# Patient Record
Sex: Male | Born: 2000 | Race: White | Hispanic: No | Marital: Single | Smoking: Never smoker
Health system: Southern US, Community
[De-identification: ages and names within clinical notes are randomized; demographics above are authoritative.]

## PROBLEM LIST (undated history)

## (undated) DIAGNOSIS — F32A Depression, unspecified: Secondary | ICD-10-CM

## (undated) DIAGNOSIS — F39 Unspecified mood [affective] disorder: Secondary | ICD-10-CM

## (undated) HISTORY — PX: APPENDECTOMY: SHX54

---

## 2019-08-13 ENCOUNTER — Ambulatory Visit (HOSPITAL_COMMUNITY)
Admission: RE | Admit: 2019-08-13 | Discharge: 2019-08-13 | Disposition: A | Discharge status: Home / Self Care | Attending: Psychiatry | Admitting: Psychiatry

## 2019-08-13 DIAGNOSIS — F331 Major depressive disorder, recurrent, moderate: Secondary | ICD-10-CM | POA: Insufficient documentation

## 2019-08-13 DIAGNOSIS — F1210 Cannabis abuse, uncomplicated: Secondary | ICD-10-CM | POA: Diagnosis not present

## 2019-08-14 ENCOUNTER — Encounter (HOSPITAL_COMMUNITY): Payer: Self-pay | Admitting: Psychiatry

## 2019-08-14 ENCOUNTER — Inpatient Hospital Stay (HOSPITAL_COMMUNITY)
Admission: RE | Admit: 2019-08-14 | Discharge: 2019-08-15 | DRG: 885 | Disposition: A | Discharge status: Home / Self Care | Attending: Psychiatry | Admitting: Psychiatry

## 2019-08-14 ENCOUNTER — Other Ambulatory Visit: Payer: Self-pay

## 2019-08-14 DIAGNOSIS — F1290 Cannabis use, unspecified, uncomplicated: Secondary | ICD-10-CM | POA: Diagnosis present

## 2019-08-14 DIAGNOSIS — Z915 Personal history of self-harm: Secondary | ICD-10-CM

## 2019-08-14 DIAGNOSIS — F649 Gender identity disorder, unspecified: Secondary | ICD-10-CM | POA: Diagnosis present

## 2019-08-14 DIAGNOSIS — F331 Major depressive disorder, recurrent, moderate: Principal | ICD-10-CM | POA: Diagnosis present

## 2019-08-14 DIAGNOSIS — R45851 Suicidal ideations: Secondary | ICD-10-CM | POA: Diagnosis present

## 2019-08-14 DIAGNOSIS — F411 Generalized anxiety disorder: Secondary | ICD-10-CM | POA: Diagnosis present

## 2019-08-14 DIAGNOSIS — U071 COVID-19: Secondary | ICD-10-CM | POA: Diagnosis present

## 2019-08-14 DIAGNOSIS — X838XXA Intentional self-harm by other specified means, initial encounter: Secondary | ICD-10-CM

## 2019-08-14 DIAGNOSIS — Z79899 Other long term (current) drug therapy: Secondary | ICD-10-CM | POA: Diagnosis not present

## 2019-08-14 DIAGNOSIS — F322 Major depressive disorder, single episode, severe without psychotic features: Secondary | ICD-10-CM | POA: Diagnosis present

## 2019-08-14 LAB — RESPIRATORY PANEL BY RT PCR (FLU A&B, COVID)
Influenza A by PCR: NEGATIVE
Influenza B by PCR: NEGATIVE
SARS Coronavirus 2 by RT PCR: POSITIVE — AB

## 2019-08-14 LAB — URINALYSIS, ROUTINE W REFLEX MICROSCOPIC
Bilirubin Urine: NEGATIVE
Glucose, UA: NEGATIVE mg/dL
Hgb urine dipstick: NEGATIVE
Ketones, ur: NEGATIVE mg/dL
Leukocytes,Ua: NEGATIVE
Nitrite: NEGATIVE
Protein, ur: NEGATIVE mg/dL
Specific Gravity, Urine: 1.016 (ref 1.005–1.030)
pH: 7 (ref 5.0–8.0)

## 2019-08-14 LAB — LIPID PANEL
Cholesterol: 140 mg/dL (ref 0–200)
HDL: 45 mg/dL (ref >40)
LDL Cholesterol: 61 mg/dL (ref 0–99)
Total CHOL/HDL Ratio: 3.1 RATIO
Triglycerides: 169 mg/dL — ABNORMAL HIGH (ref <150)
VLDL: 34 mg/dL (ref 0–40)

## 2019-08-14 LAB — COMPREHENSIVE METABOLIC PANEL
ALT: 16 U/L (ref 0–44)
AST: 16 U/L (ref 15–41)
Albumin: 4.1 g/dL (ref 3.5–5.0)
Alkaline Phosphatase: 45 U/L (ref 38–126)
Anion gap: 7 (ref 5–15)
BUN: 18 mg/dL (ref 6–20)
CO2: 28 mmol/L (ref 22–32)
Calcium: 9.1 mg/dL (ref 8.9–10.3)
Chloride: 105 mmol/L (ref 98–111)
Creatinine, Ser: 1.13 mg/dL (ref 0.61–1.24)
GFR calc Af Amer: >60 mL/min (ref >60)
GFR calc non Af Amer: >60 mL/min (ref >60)
Glucose, Bld: 84 mg/dL (ref 70–99)
Potassium: 4.3 mmol/L (ref 3.5–5.1)
Sodium: 140 mmol/L (ref 135–145)
Total Bilirubin: 0.6 mg/dL (ref 0.3–1.2)
Total Protein: 6.9 g/dL (ref 6.5–8.1)

## 2019-08-14 LAB — RAPID URINE DRUG SCREEN, HOSP PERFORMED
Amphetamines: NOT DETECTED
Barbiturates: NOT DETECTED
Benzodiazepines: NOT DETECTED
Cocaine: NOT DETECTED
Opiates: NOT DETECTED
Tetrahydrocannabinol: NOT DETECTED

## 2019-08-14 LAB — TSH: TSH: 0.706 u[IU]/mL (ref 0.350–4.500)

## 2019-08-14 LAB — CBC
HCT: 43.5 % (ref 39.0–52.0)
Hemoglobin: 14.6 g/dL (ref 13.0–17.0)
MCH: 29.2 pg (ref 26.0–34.0)
MCHC: 33.6 g/dL (ref 30.0–36.0)
MCV: 87 fL (ref 80.0–100.0)
Platelets: 240 10*3/uL (ref 150–400)
RBC: 5 MIL/uL (ref 4.22–5.81)
RDW: 12.2 % (ref 11.5–15.5)
WBC: 5.1 10*3/uL (ref 4.0–10.5)
nRBC: 0 % (ref 0.0–0.2)

## 2019-08-14 LAB — HEMOGLOBIN A1C
Hgb A1c MFr Bld: 4.8 % (ref 4.8–5.6)
Mean Plasma Glucose: 91.06 mg/dL

## 2019-08-14 LAB — ETHANOL: Alcohol, Ethyl (B): <10 mg/dL (ref <10)

## 2019-08-14 MED ORDER — ALUM & MAG HYDROXIDE-SIMETH 200-200-20 MG/5ML PO SUSP: 30.0000 mL | ORAL | Status: DC | PRN

## 2019-08-14 MED ORDER — MAGNESIUM HYDROXIDE 400 MG/5ML PO SUSP: 30.0000 mL | Freq: Every day | ORAL | Status: DC | PRN

## 2019-08-14 MED ORDER — FLUOXETINE HCL 20 MG PO CAPS
20.0000 mg | ORAL_CAPSULE | Freq: Every day | ORAL | Status: DC
  Administered 2019-08-14 – 2019-08-15 (×2): 20 mg via ORAL
  Filled 2019-08-14 (×2): qty 1
  Filled 2019-08-14: qty 7
  Filled 2019-08-14: qty 1

## 2019-08-14 MED ORDER — ACETAMINOPHEN 325 MG PO TABS: 650.0000 mg | ORAL_TABLET | Freq: Four times a day (QID) | ORAL | Status: DC | PRN

## 2019-08-14 MED ORDER — TRAZODONE HCL 50 MG PO TABS
50.0000 mg | ORAL_TABLET | Freq: Once | ORAL | Status: AC
  Administered 2019-08-14: 50 mg via ORAL
  Filled 2019-08-14: qty 1

## 2019-08-14 MED ORDER — PRENATAL MULTIVITAMIN CH
1.0000 | ORAL_TABLET | Freq: Every day | ORAL | Status: DC
  Administered 2019-08-14: 1 via ORAL
  Filled 2019-08-14 (×4): qty 1

## 2019-08-14 MED ORDER — HYDROXYZINE HCL 25 MG PO TABS
25.0000 mg | ORAL_TABLET | Freq: Once | ORAL | Status: AC
  Administered 2019-08-14: 25 mg via ORAL
  Filled 2019-08-14: qty 1

## 2019-08-14 MED ORDER — TEMAZEPAM 15 MG PO CAPS
30.0000 mg | ORAL_CAPSULE | Freq: Every day | ORAL | Status: DC
  Administered 2019-08-14: 22:00:00 30 mg via ORAL
  Filled 2019-08-14: qty 2

## 2019-08-14 NOTE — H&P (Signed)
Psychiatric Admission Assessment Adult  Patient Identification: Edwin Burton MRN:  [id number] Date of Evaluation:  08/14/2019 Chief Complaint:  MDD Principal Diagnosis: Suicidal ideation Diagnosis:  Principal Problem:   Suicidal ideation Active Problems:   Anxiety state   Suicide (HCC)  History of Present Illness:   This is a 19 year old student presented voluntarily due to the severity of suicidal thoughts.  The patient had been on Lexapro and Abilify previously but is not on it now.  The patient acknowledges cannabis usage "about 1 time a week".  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  Further this is a transgender use, who cannot report why the depression was of recurrence other than being off of antidepressants. 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  According to the assessment team note  Edwin Burton is an 19 y.o. male.  -Patient is a Consulting civil engineer at [employer].  A friend brought her to [facility] from campus.  Patient is a male transitioning to male and prefers male pronouns.  Pt says she has been having SI more intensely over the last four days.  Patient says that "If I had a gun in front of me, I would use it."  Patient has no access to a gun at this time.  He says there is one in her home in [facility] but it is secured.  Patient has had about 5 previous suicide attempts.  Patient denies any HI or A/V hallucinations.  Patient says she uses ETOH about once in a month.  Her marijuana use is about once per week.  Last use was on Saturday evening (02/27).    Patient says she started the transition in November 2020 and is supposed to start HRT later this month.  Patient has a gender therapist.  She says she has a psychiatrist named Dr.  Elmer Picker.  Patient says she has run out of her medication and has not had any in the last few days.  Patient says she felt that her meds were not working so she doubled up on the medications per day.  She says that it helped but now she is without medications.  Patient has a anxious affect.  She is not responding to internal stimuli.  She has thought process which is logical and coherent.  Patient has poor eye contact.  She is oriented x4.  Pt has fair appetite and poor concentration.  -Clinician discussed patient care with Lerry Liner, NP  She recommends patient be observed overnight and seen by psychiatry in AM.  Diagnosis: F33.1 MDD recurrent episode, moderate; F12.10 Cannabis use d/o mild  Associated Signs/Symptoms: Depression Symptoms:  anhedonia, (Hypo) Manic Symptoms:  n/a Anxiety Symptoms:  Excessive Worry, Psychotic Symptoms:  n/a PTSD Symptoms: NA Total Time spent with patient: 45 minutes  Past Psychiatric History: 1 prior admission  Is the patient at risk to self? Yes.    Has the patient been a risk to self in the past 6 months? No.  Has the patient been a risk to self within the distant past? No.  Is the patient a risk to others? No.  Has the patient been a risk to others in the past 6 months? No.  Has the patient been a risk to others within the distant past? No.   Prior  Inpatient Therapy:   Prior Outpatient Therapy:    Alcohol Screening: 1. How often do you have a drink containing alcohol?: Monthly or less 2. How many drinks containing alcohol do you have on a typical day when you are drinking?: 1 or 2 3. How often do you have six or more drinks on one occasion?: Less than monthly AUDIT-C Score: 2 4. How often during the last year have you found that you were not able to stop drinking once you had started?: Never 5. How often during the last year have you failed to do what was normally expected from you becasue of drinking?: Less than monthly 6. How often during the  last year have you needed a first drink in the morning to get yourself going after a heavy drinking session?: Less than monthly 7. How often during the last year have you had a feeling of guilt of remorse after drinking?: Less than monthly 8. How often during the last year have you been unable to remember what happened the night before because you had been drinking?: Less than monthly 9. Have you or someone else been injured as a result of your drinking?: No 10. Has a relative or friend or a doctor or another health worker been concerned about your drinking or suggested you cut down?: No Alcohol Use Disorder Identification Test Final Score (AUDIT): 6 Alcohol Brief Interventions/Follow-up: AUDIT Score <7 follow-up not indicated Substance Abuse History in the last 12 months:  Yes.   Consequences of Substance Abuse: NA Previous Psychotropic Medications: Yes  Psychological Evaluations: No  Past Medical History: History reviewed. No pertinent past medical history. History reviewed. No pertinent surgical history. Family History: History reviewed. No pertinent family history. Family Psychiatric  History: Denies Tobacco Screening:   Social History:  Social History   Substance and Sexual Activity  Alcohol Use None     Social History   Substance and Sexual Activity  Drug Use Not on file    Additional Social History:                           Allergies:  No Known Allergies Lab Results:  Results for orders placed or performed during the hospital encounter of 08/14/19 (from the past 48 hour(s))  Respiratory Panel by RT PCR (Flu A&B, Covid) - Nasopharyngeal Swab     Status: Abnormal   Collection Time: 08/14/19  1:01 AM   Specimen: Nasopharyngeal Swab  Result Value Ref Range   SARS Coronavirus 2 by RT PCR POSITIVE (A) NEGATIVE    Comment: RESULT CALLED TO, READ BACK BY AND VERIFIED WITH: WRIGHT,J. RN AT 0900 08/14/19 MULLINS,T (NOTE) SARS-CoV-2 target nucleic acids are  DETECTED. SARS-CoV-2 RNA is generally detectable in upper respiratory specimens  during the acute phase of infection. Positive results are indicative of the presence of the identified virus, but do not rule out bacterial infection or co-infection with other pathogens not detected by the test. Clinical correlation with patient history and other diagnostic information is necessary to determine patient infection status. The expected result is Negative. Fact Sheet for Patients:  https://www.moore.com/ Fact Sheet for Healthcare Providers: https://www.young.biz/ This test is not yet approved or cleared by the [place] FDA and  has been authorized for detection and/or diagnosis of SARS-CoV-2 by FDA under an Emergency Use Authorization (EUA).  This EUA will remain in effect (meaning this test can be use d) for the duration of  the COVID-19 declaration under Section  564(b)(1) of the Act, 21 U.S.C. section 360bbb-3(b)(1), unless the authorization is terminated or revoked sooner.    Influenza A by PCR NEGATIVE NEGATIVE   Influenza B by PCR NEGATIVE NEGATIVE    Comment: (NOTE) The Xpert Xpress SARS-CoV-2/FLU/RSV assay is intended as an aid in  the diagnosis of influenza from Nasopharyngeal swab specimens and  should not be used as a sole basis for treatment. Nasal washings and  aspirates are unacceptable for Xpert Xpress SARS-CoV-2/FLU/RSV  testing. Fact Sheet for Patients: https://www.moore.com/ Fact Sheet for Healthcare Providers: https://www.young.biz/ This test is not yet approved or cleared by the [place] FDA and  has been authorized for detection and/or diagnosis of SARS-CoV-2 by  FDA under an Emergency Use Authorization (EUA). This EUA will remain  in effect (meaning this test can be used) for the duration of the  Covid-19 declaration under Section 564(b)(1) of the Act, 21  U.S.C. section  360bbb-3(b)(1), unless the authorization is  terminated or revoked. Performed at [facility], [address] [place], [place] [address]     Blood Alcohol level:  No results found for: [facility]  Metabolic Disorder Labs:  No results found for: HGBA1C, MPG No results found for: PROLACTIN No results found for: CHOL, TRIG, HDL, CHOLHDL, VLDL, LDLCALC  Current Medications: No current facility-administered medications for this encounter.   PTA Medications: Medications Prior to Admission  Medication Sig Dispense Refill Last Dose  . ARIPiprazole (ABILIFY) 2 MG tablet Take 2 mg by mouth daily.     Marland Kitchen escitalopram (LEXAPRO) 10 MG tablet Take 10 mg by mouth daily.       Musculoskeletal: Strength & Muscle Tone: within normal limits Gait & Station: normal Patient leans: N/A  Psychiatric Specialty Exam: Physical Exam  Review of Systems  Blood pressure (!) 159/91, pulse (!) 105, temperature 98 F (36.7 C), temperature source Oral, resp. rate 20, SpO2 99 %.There is no height or weight on file to calculate BMI.  General Appearance: Casual  Eye Contact:  Fair  Speech:  Clear and Coherent  Volume:  Decreased  Mood:  Anxious and Depressed  Affect:  Appropriate  Thought Process:  Goal Directed  Orientation:  Full (Time, Place, and Person)  Thought Content:  Logical and Rumination  Suicidal Thoughts:  Yes.  without intent/plan  Homicidal Thoughts:  No  Memory:  Immediate;   Fair Recent;   Fair Remote;   Fair  Judgement:  Fair  Insight:  Fair  Psychomotor Activity:  Normal  Concentration:  Concentration: Fair and Attention Span: Fair  Recall:  [employer] of Knowledge:  Fair  Language:  Fair  Akathisia:  Negative  Handed:  Right  AIMS (if indicated):     Assets:  Physical Health Resilience  ADL's:  Intact  Cognition:  WNL  Sleep:       Treatment Plan Summary: Daily contact with patient to assess and evaluate symptoms and progress in treatment and Medication  management  Observation Level/Precautions:  15 minute checks  Laboratory:  UDS  Psychotherapy:    Medications:    Consultations:    Discharge Concerns:    Estimated LOS:  Other:    Axis I depression recurrent severe without psychosis Axis II rule out personality disorder Axis III coronavirus positive  Plans begin antidepressant therapy here in observation monitor for safety probable discharge tomorrow if cannot fully contract   Physician Treatment Plan for Primary Diagnosis: Suicidal ideation Long Term Goal(s): Improvement in symptoms so as ready for discharge  Short Term Goals: Ability to maintain clinical measurements within normal limits will improve, Compliance with prescribed medications will improve and Ability to identify triggers associated with substance abuse/mental health issues will improve  Physician Treatment Plan for Secondary Diagnosis: Principal Problem:   Suicidal ideation Active Problems:   Anxiety state   Suicide ([place])  Long Term Goal(s): Improvement in symptoms so as ready for discharge  Short Term Goals: Ability to identify changes in lifestyle to reduce recurrence of condition will improve, Ability to verbalize feelings will improve and Ability to disclose and discuss suicidal ideas  I certify that inpatient services furnished can reasonably be expected to improve the patient's condition.    Johnn Hai, MD 3/2/202111:33 AM

## 2019-08-14 NOTE — BH Assessment (Signed)
B[facility] Assessment Progress Note  Per Burlene Arnt, MD, this voluntary pt requires psychiatric hospitalization at this time.  Edwin Burton has assigned pt to [facility] Rm 305-1.  Pt's nurse, Jan, has been notified.  Doylene Canning, [place] Behavioral Health Coordinator [phone]

## 2019-08-14 NOTE — Progress Notes (Signed)
Received fax from [facility] with positive covid test on Feb 8th. Contacted Infection control and told past 10 days with no symptoms do not need to isolate. Placed [facility] results in paper chart.

## 2019-08-14 NOTE — Plan of Care (Signed)
[facility] Observation Crisis Plan  Reason for Crisis Plan:  Crisis Stabilization   Plan of Care:  Referral for Inpatient Hospitalization  Family Support:      Current Living Environment:     Insurance:   Hospital Account    Name Acct ID Class Status Primary Coverage   [place], [place] [id number] BEHAVIORAL HEALTH OBSERVATION Open None        Guarantor Account (for Hospital Account [id number])    Name Relation to Pt Service Area Active? Acct Type   [name] Self [facility] Yes Behavioral Health   Address Phone       [address] [place], [place] [address] [phone](H)          Coverage Information (for Hospital Account [id number])    Not on file      Legal Guardian:     Primary Care Provider:  Patient, No Pcp Per  Current Outpatient Providers:  none  Psychiatrist:     Counselor/Therapist:     Compliant with Medications:  No  Additional Information:   Tasia Catchings 3/2/20213:19 AM

## 2019-08-14 NOTE — Progress Notes (Signed)
   08/14/19 2301  Psych Admission Type (Psych Patients Only)  Admission Status Voluntary  Psychosocial Assessment  Patient Complaints Anxiety;Depression  Eye Contact Brief  Facial Expression Flat  Affect Depressed  Speech Logical/coherent  Interaction Assertive  Motor Activity Other (Comment) (WNL)  Appearance/Hygiene Unremarkable  Behavior Characteristics Cooperative  Mood Depressed;Anxious;Pleasant  Thought Process  Coherency WDL  Content WDL  Delusions None reported or observed  Perception WDL  Hallucination None reported or observed  Judgment WDL  Confusion None  Danger to Self  Current suicidal ideation? Passive  Self-Injurious Behavior No self-injurious ideation or behavior indicators observed or expressed   Agreement Not to Harm Self Yes  Description of Agreement verbal  Danger to Others  Danger to Others None reported or observed

## 2019-08-14 NOTE — BH Assessment (Signed)
Assessment Note  Edwin Burton is an 19 y.o. male.  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  Patient is a male transitioning to male and prefers male pronouns.  Pt says she has been having SI more intensely over the last four days.  Patient says that "If I had a gun in front of me, I would use it."  Patient has no access to a gun at this time.  He says there is one in her home in [facility] but it is secured.  Patient has had about 5 previous suicide attempts.  Patient denies any HI or A/V hallucinations.  Patient says she uses ETOH about once in a month.  Her marijuana use is about once per week.  Last use was on Saturday evening (02/27).    Patient says she started the transition in November 2020 and is supposed to start HRT later this month.  Patient has a gender therapist.  She says she has a psychiatrist named Dr. Elmer Picker.  Patient says she has run out of her medication and has not had any in the last few days.  Patient says she felt that her meds were not working so she doubled up on the medications per day.  She says that it helped but now she is without medications.  Patient has a anxious affect.  She is not responding to internal stimuli.  She has thought process which is logical and coherent.  Patient has poor eye contact.  She is oriented x4.  Pt has fair appetite and poor concentration.  -Clinician discussed patient care with Lerry Liner, NP  She recommends patient be observed overnight and seen by psychiatry in AM.  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    Family History: No family history on file.  Social History:  has no history on file for tobacco, alcohol, and drug.  Additional Social History:  Alcohol / Drug Use Pain Medications: None Prescriptions: Citalopram and another med that is a mood stabilizer. Over the Counter:  None History of alcohol / drug use?: Yes Substance #1 Name of Substance 1: Marijuana 1 - Age of First Use: 69 years of age 82 - Amount (size/oz): Varies.  Smoke with friends 1 - Frequency: About once per week 1 - Duration: ongoing 1 - Last Use / Amount: 02/27  CIWA:   COWS:    Allergies: Not on File  Home Medications: (Not in a hospital admission)   OB/GYN Status:  No LMP for male patient.  General Assessment Data Location of Assessment: [facility] Assessment Services TTS Assessment: In system Is this a Tele or Face-to-Face Assessment?: Face-to-Face Is this an Initial Assessment or a Re-assessment for this encounter?: Initial Assessment Patient Accompanied by:: N/A Language Other than English: No Living Arrangements: Other (Comment)(On campus at [employer].  Single room.) What gender do you identify as?: Male(Transitioning from male to male.  Use she/her pronouns.) Marital status: Single Pregnancy Status: No Living Arrangements: Alone(Single room on campus at [facility]) Can pt return to current living arrangement?: Yes Admission Status: Voluntary Is patient capable of signing voluntary admission?: Yes Referral Source: Self/Family/Friend Insurance type: Tricare  Medical Screening Exam [facility] Walk-in ONLY) Medical Exam completed: Ronnald Nian, NP)  Crisis Care Plan Living Arrangements: Alone(Single room on campus at [facility]) Name of Psychiatrist: Dr. Elmer Picker at [facility] Name of Therapist: Dr. Raiford Noble  [facility]  Education Status Is patient currently in school?: Yes Current Grade: Freshman Highest grade of school patient has completed: 12th grade Name of school: [employer]: patient IEP information if applicable: N/A  Risk to self with the past 6 months Suicidal Ideation: Yes-Currently Present Has patient been a risk to self within the past 6 months prior to admission? : No Suicidal Intent: No Has patient had any suicidal intent  within the past 6 months prior to admission? : No Is patient at risk for suicide?: No Suicidal Plan?: No(Passive ideation of "not wanting ot wake up today.) Has patient had any suicidal plan within the past 6 months prior to admission? : No Access to Means: No What has been your use of drugs/alcohol within the last 12 months?: THC Previous Attempts/Gestures: Yes How many times?: (multiple) Other Self Harm Risks: None Triggers for Past Attempts: Unpredictable Intentional Self Injurious Behavior: Cutting Comment - Self Injurious Behavior: Last incident a month ago Family Suicide History: No Recent stressful life event(s): Other (Comment)("I don't deserve happiness") Persecutory voices/beliefs?: No Depression: Yes Depression Symptoms: Despondent, Feeling worthless/self pity, Loss of interest in usual pleasures, Fatigue, Tearfulness, Isolating Substance abuse history and/or treatment for substance abuse?: No Suicide prevention information given to non-admitted patients: Not applicable  Risk to Others within the past 6 months Homicidal Ideation: No Does patient have any lifetime risk of violence toward others beyond the six months prior to admission? : No Thoughts of Harm to Others: No Current Homicidal Intent: No Current Homicidal Plan: No Access to Homicidal Means: No Identified Victim: No one History of harm to others?: No Assessment of Violence: None Noted Violent Behavior Description: None reported Does patient have access to weapons?: No(Gun at home in [facility].  It is secured.) Criminal Charges Pending?: No Does patient have a court date: No Is patient on probation?: No  Psychosis Hallucinations: None noted Delusions: None noted  Mental Status Report Appearance/Hygiene: Unremarkable Eye Contact: Poor Motor Activity: Freedom of movement, Restlessness Speech: Logical/coherent Level of Consciousness: Alert, Restless Mood: Anxious, Depressed, Sad Affect: Anxious,  Sad Anxiety Level: Severe Thought Processes: Coherent, Relevant Judgement: Partial Orientation: Person, Situation, Place, Time Obsessive Compulsive Thoughts/Behaviors: None  Cognitive Functioning Concentration: Decreased Memory: Recent Impaired, Remote Intact Is patient IDD: No Insight: Fair Impulse Control: Fair Appetite: Good Have you had any weight changes? : No Change Sleep: No Change Total Hours of Sleep: 8 Vegetative Symptoms: None  ADLScreening [facility] Assessment Services) Patient's cognitive ability adequate to safely complete daily activities?: Yes Patient able to express need for assistance with ADLs?: Yes Independently performs ADLs?: Yes (appropriate for developmental age)  Prior Inpatient Therapy Prior Inpatient Therapy: Yes(Father, mother, siblings) Prior Therapy Dates: 2017 [facility] Reason for Treatment: SI  Prior Outpatient Therapy Prior Outpatient Therapy: Yes Prior Therapy Dates: current Prior Therapy Facilty/Provider(s): Dr. Elmer Picker at [facility] Reason for Treatment: SI, depression Does patient have an ACCT team?: No Does patient have Intensive In-House Services?  : No Does patient have [facility] services? : No Does patient have P4CC services?: No  ADL Screening (condition at time of admission) Patient's cognitive ability adequate to safely complete daily activities?: Yes Is the patient deaf or have difficulty hearing?: No Does the patient have difficulty seeing, even when wearing glasses/contacts?: No Does the patient have difficulty concentrating, remembering, or making decisions?: Yes Patient able to express need for assistance with ADLs?: Yes Does the patient have difficulty dressing or bathing?: No Independently  performs ADLs?: Yes (appropriate for developmental age) Does the patient have difficulty walking or climbing stairs?: No Weakness of Legs: None Weakness of  Arms/Hands: None       Abuse/Neglect Assessment (Assessment to be complete while patient is alone) Abuse/Neglect Assessment Can Be Completed: Yes Physical Abuse: Denies Verbal Abuse: Denies Sexual Abuse: Denies Exploitation of patient/patient's resources: Denies Self-Neglect: Denies     Regulatory affairs officer (For Healthcare) Does Patient Have a Medical Advance Directive?: No Would patient like information on creating a medical advance directive?: No - Patient declined          Disposition:  Disposition Initial Assessment Completed for this Encounter: Yes Disposition of Patient: Admit(OBS unit) Type of inpatient treatment program: Adult(OBS unit) Patient refused recommended treatment: No Mode of transportation if patient is discharged/movement?: N/A Patient referred to: Other (Comment)  On Site Evaluation by:   Reviewed with Physician:    Raymondo Band 08/14/2019 12:52 AM

## 2019-08-14 NOTE — H&P (Signed)
BH Observation Unit Provider Admission PAA/H&P  Patient Identification: Edwin Burton MRN:  [id number] Date of Evaluation:  08/14/2019 Chief Complaint:  MDD Principal Diagnosis: <principal problem not specified> Diagnosis:  Active Problems:   * No active hospital problems. *  History of Present Illness: Per TTS: Edwin Burton is an 19 y.o. male.  -Patient is a Consulting civil engineer at [employer].  A friend brought her to [facility] from campus.  Patient is a male transitioning to male and prefers male pronouns.  Pt says she has been having SI more intensely over the last four days.  Patient says that "If I had a gun in front of me, I would use it."  Patient has no access to a gun at this time.  He says there is one in her home in [facility] but it is secured.  Patient has had about 5 previous suicide attempts.  Patient denies any HI or A/V hallucinations.  Patient says she uses ETOH about once in a month.  Her marijuana use is about once per week.  Last use was on Saturday evening (02/27).    Patient says she started the transition in November 2020 and is supposed to start HRT later this month.  Patient has a gender therapist.  She says she has a psychiatrist named Dr. Elmer Picker.  Patient says she has run out of her medication and has not had any in the last few days.  Patient says she felt that her meds were not working so she doubled up on the medications per day.  She says that it helped but now she is without medications.  Patient has a anxious affect.  She is not responding to internal stimuli.  She has thought process which is logical and coherent.  Patient has poor eye contact.  She is oriented x4.  Pt has fair appetite and poor concentration.  Patient is pleasant and engaging in the assessment. At this time he is unable to contract for safety. Recommend inpatient for psychiatric stabilization and medication adjustment.   Associated Signs/Symptoms: Depression Symptoms:  hopelessness, suicidal  thoughts without plan, (Hypo) Manic Symptoms:  Delusions, Anxiety Symptoms:  Excessive Worry, Psychotic Symptoms:  na PTSD Symptoms: NA Total Time spent with patient: 1 hour  Past Psychiatric History: yes  Is the patient at risk to self? Yes.    Has the patient been a risk to self in the past 6 months? Yes.    Has the patient been a risk to self within the distant past? Yes.    Is the patient a risk to others? No.  Has the patient been a risk to others in the past 6 months? No.  Has the patient been a risk to others within the distant past? No.   Prior Inpatient Therapy:   Prior Outpatient Therapy:    Alcohol Screening: 1. How often do you have a drink containing alcohol?: Monthly or less 2. How many drinks containing alcohol do you have on a typical day when you are drinking?: 1 or 2 3. How often do you have six or more drinks on one occasion?: Less than monthly AUDIT-C Score: 2 4. How often during the last year have you found that you were not able to stop drinking once you had started?: Never 5. How often during the last year have you failed to do what was normally expected from you becasue of drinking?: Less than monthly 6. How often during the last year have you needed a first drink in the morning  to get yourself going after a heavy drinking session?: Less than monthly 7. How often during the last year have you had a feeling of guilt of remorse after drinking?: Less than monthly 8. How often during the last year have you been unable to remember what happened the night before because you had been drinking?: Less than monthly 9. Have you or someone else been injured as a result of your drinking?: No 10. Has a relative or friend or a doctor or another health worker been concerned about your drinking or suggested you cut down?: No Alcohol Use Disorder Identification Test Final Score (AUDIT): 6 Alcohol Brief Interventions/Follow-up: AUDIT Score <7 follow-up not indicated Substance  Abuse History in the last 12 months:  No. Consequences of Substance Abuse: NA Previous Psychotropic Medications: No  Psychological Evaluations: Yes  Past Medical History: History reviewed. No pertinent past medical history. History reviewed. No pertinent surgical history. Family History: History reviewed. No pertinent family history. Family Psychiatric History: unknown Tobacco Screening:   Social History:  Social History   Substance and Sexual Activity  Alcohol Use None     Social History   Substance and Sexual Activity  Drug Use Not on file    Additional Social History:                           Allergies:  No Known Allergies Lab Results: No results found for this or any previous visit (from the past 48 hour(s)).  Blood Alcohol level:  No results found for: [facility]  Metabolic Disorder Labs:  No results found for: HGBA1C, MPG No results found for: PROLACTIN No results found for: CHOL, TRIG, HDL, CHOLHDL, VLDL, LDLCALC  Current Medications: No current facility-administered medications for this encounter.   PTA Medications: No medications prior to admission.    Musculoskeletal: Strength & Muscle Tone: within normal limits Gait & Station: normal Patient leans: N/A  Psychiatric Specialty Exam: Physical Exam  Nursing note and vitals reviewed. Constitutional: She is oriented to person, place, and time. She appears well-developed.  HENT:  Head: Normocephalic.  Eyes: Pupils are equal, round, and reactive to light.  Respiratory: Effort normal.  Musculoskeletal:        General: Normal range of motion.     Cervical back: Normal range of motion.  Neurological: She is alert and oriented to person, place, and time.  Skin: Skin is warm and dry.  Psychiatric: Her speech is normal. Her mood appears anxious. Thought content is paranoid. Cognition and memory are normal. She expresses impulsivity. She exhibits a depressed mood. She expresses suicidal ideation.     Review of Systems  Psychiatric/Behavioral: Positive for suicidal ideas. The patient is nervous/anxious.   All other systems reviewed and are negative.   Blood pressure (!) 159/91, pulse (!) 105, temperature 98 F (36.7 C), temperature source Oral, resp. rate 20, SpO2 99 %.There is no height or weight on file to calculate BMI.  General Appearance: Casual  Eye Contact:  Fair  Speech:  Clear and Coherent  Volume:  Normal  Mood:  Anxious and Depressed  Affect:  Congruent  Thought Process:  Coherent and Descriptions of Associations: Intact  Orientation:  Full (Time, Place, and Person)  Thought Content:  WDL and Logical  Suicidal Thoughts:  Yes.  with intent/plan  Homicidal Thoughts:  No  Memory:  Recent;   Fair  Judgement:  Fair  Insight:  Lacking  Psychomotor Activity:  Normal  Concentration:  Concentration: Fair  Recall:  Smiley [place] of Knowledge:  Fair  Language:  Fair  Akathisia:  NA  Handed:  Right  AIMS (if indicated):     Assets:  Communication Skills Desire for Improvement  ADL's:  Intact  Cognition:  WNL  Sleep:         Treatment Plan Summary: Daily contact with patient to assess and evaluate symptoms and progress in treatment and Medication management  Observation Level/Precautions:  15 minute checks Laboratory:  CBC Chemistry Profile Folic Acid HbAIC UDS Psychotherapy:  Yes Millieu interactions with therapeutic communications Medications:  adjustments Consultations:   Discharge Concerns:   Estimated LOS: Other:      Deloria Lair, NP 3/2/20216:44 AM

## 2019-08-14 NOTE — Progress Notes (Signed)
Adult Psychoeducational Group Note  Date:  08/14/2019 Time:  9:24 PM  Group Topic/Focus:  Wrap-Up Group:   The focus of this group is to help patients review their daily goal of treatment and discuss progress on daily workbooks.  Participation Level:  Active  Participation Quality:  Appropriate  Affect:  Appropriate  Cognitive:  Appropriate  Insight: Appropriate  Engagement in Group:  Engaged  Modes of Intervention:  Discussion  Additional Comments:  Patient attended group and participated.   Daeron Carreno W Mackenzee Becvar 08/14/2019, 9:24 PM

## 2019-08-14 NOTE — Progress Notes (Signed)
Pt has been transferred to 305-1. Report given to [facility].

## 2019-08-14 NOTE — Progress Notes (Signed)
Patient ID: Edwin Burton, adult   DOB: 2001-03-30, 19 y.o.   MRN: [id number] Pt A&O x 4, presents with SI, plan to use a gun.  Hx of 5 previous SI attempts.  Denies HI or AVH.  Pt is male transitioning to Male.  Pt cooperative and anxious.  Skin search completed, no distress noted,  Monitoring for safety.

## 2019-08-14 NOTE — Progress Notes (Signed)
   08/14/19 1000  Psych Admission Type (Psych Patients Only)  Admission Status Voluntary  Psychosocial Assessment  Patient Complaints Depression  Eye Contact Brief  Facial Expression Flat  Affect Depressed  Speech Logical/coherent  Interaction Assertive  Motor Activity Other (Comment) (WDL)  Appearance/Hygiene Unremarkable  Behavior Characteristics Cooperative  Mood Depressed  Thought Process  Coherency WDL  Content WDL  Delusions None reported or observed  Perception WDL  Hallucination None reported or observed  Judgment WDL  Confusion WDL  Danger to Self  Current suicidal ideation? Passive  Self-Injurious Behavior No self-injurious ideation or behavior indicators observed or expressed   Agreement Not to Harm Self Yes  Description of Agreement verbal  Danger to Others  Danger to Others None reported or observed   LW lab called with positive covid results. Per pt he was tested recently at [facility] with positive result. Unsure of date. Contacted Lorenda Ishihara [phone] with [facility] student health and pt gave verbal consent to have results faxed to [facility]. Waiting on Fax. Director and MD notified.

## 2019-08-15 MED ORDER — FLUOXETINE HCL 20 MG PO CAPS: 20.0000 mg | ORAL_CAPSULE | Freq: Every day | ORAL | 1 refills | Status: DC

## 2019-08-15 MED ORDER — ENLYTE PO CAPS: ORAL_CAPSULE | ORAL | 11 refills | Status: DC

## 2019-08-15 MED ORDER — TEMAZEPAM 30 MG PO CAPS: 30.0000 mg | ORAL_CAPSULE | Freq: Every evening | ORAL | 0 refills | Status: DC | PRN

## 2019-08-15 NOTE — Progress Notes (Addendum)
Pt informed this Clinical research associate that she has a tele-medicine visit tomorrow at noon. Pt wants to keep that appointment and wondered if she would be able to have access to her cell phone (supervised) to complete the visit. Pt states that they will contact her by cell phone. Informed pt that I would leave a note for the treatment team about this and that she should discuss it with her provider in the morning. Will also inform day shift RN about pt's request.

## 2019-08-15 NOTE — Progress Notes (Signed)
Recreation Therapy Notes  Date:  3.3.21 Time: 0930 Location: 300 Hall Dayroom  Group Topic: Stress Management  Goal Area(s) Addresses:  Patient will identify positive stress management techniques. Patient will identify benefits of using stress management post d/c.  Behavioral Response: Engaged  Intervention: Stress Management  Activity :  Guided Imagery.  LRT read a script that took patients on a mental vacation to the beach to listen to the peaceful waves.  Patients were to listen and follow along as script was read to engage in activity.  Education:  Stress Management, Discharge Planning.   Education Outcome: Acknowledges Education  Clinical Observations/Feedback: Pt attended and participated in activity.    Caroll Rancher, LRT/CTRS        Lillia Abed, Abbott Jasinski A 08/15/2019 10:59 AM

## 2019-08-15 NOTE — Progress Notes (Signed)
Discharge Note:  Patient discharged home.  Patient denied SI and HI.  Denied A/V hallucinations.  Denied pain.  Suicide prevention information given and discussed with patient who stated he understood and had no questions.  Patient stated he received all his belongings, clothing, toiletries, etc.  Patient stated he appreciated all assistance received from [facility] staff.  All required discharge information distributed per requirements.

## 2019-08-15 NOTE — Progress Notes (Signed)
CSW spoke with pt regarding discharge plan and follow up. Pt reported that she sees Dr Elmer Picker with [facility] and reported she also has a therapist she sees, but pt chose not to provide information for therapist stating she will schedule her own therapy appointment.   Iris Pert, MSW, LCSW Clinical Social Work 08/15/2019 10:18 AM

## 2019-08-15 NOTE — Progress Notes (Signed)
  [facility] Adult Case Management Discharge Plan :  Will you be returning to the same living situation after discharge:  Yes,  patient is returning to his dorm on [employer] campus At discharge, do you have transportation home?: Yes,  patient's friend is picking him up at discharge Do you have the ability to pay for your medications: No.  Release of information consent forms completed and in the chart;  Patient's signature needed at discharge.  Patient to Follow up at: Follow-up Information    [facility]. Schedule an appointment as soon as possible for a visit.   Why: CSW left a voicemail requesting a follow up appointment with Dr. Haskel Schroeder. The office will contact you with your appointment information. If you do not hear from the office within 48 hours, please contact them regarding your appointment informtion.  Contact information: [address] [place]  [place], [place] [address]  Phone: [phone] Fax: [phone]       [facility] Follow up.   Why: A referral for therapy services has been made on your behalf. Receptionist will call you with your appointment information. Please be sure to contact office if you have any additional questions or concerns.  Contact information: [place] [address] [facility] [address] [place], [place] [address] Phone: [phone] Fax: [phone]          Next level of care provider has access to [facility] Link:yes  Safety Planning and Suicide Prevention discussed: Yes,  with the patient      Has patient been referred to the Quitline?: N/A patient is not a smoker  Patient has been referred for addiction treatment: N/A  [name], LCSWA 08/15/2019, 10:08 AM

## 2019-08-15 NOTE — BHH Suicide Risk Assessment (Signed)
[facility] Discharge Suicide Risk Assessment   Principal Problem: Suicidal ideation Discharge Diagnoses: Principal Problem:   Suicidal ideation Active Problems:   Anxiety state   Suicide (HCC)   MDD (major depressive disorder), severe (HCC)   Total Time spent with patient: 45 minutes Musculoskeletal: Strength & Muscle Tone: within normal limits Gait & Station: normal Patient leans: N/A  Psychiatric Specialty Exam: Physical Exam  Review of Systems  Blood pressure (!) 129/116, pulse 92, temperature (!) 97.3 F (36.3 C), temperature source Oral, resp. rate 20, SpO2 100 %.There is no height or weight on file to calculate BMI.  General Appearance: Casual  Eye Contact:  Good  Speech:  Clear and Coherent  Volume:  Normal  Mood:  Euthymic  Affect:  Restricted  Thought Process:  Coherent and Goal Directed  Orientation:  Full (Time, Place, and Person)  Thought Content:  Tangential  Suicidal Thoughts:  No  Homicidal Thoughts:  No  Memory:  Immediate;   Fair Recent;   Fair Remote;   Fair  Judgement:  Fair  Insight:  Fair  Psychomotor Activity:  Normal  Concentration:  Concentration: Fair and Attention Span: Good  Recall:  [employer] of Knowledge:  Fair  Language:  Good  Akathisia:  Negative  Handed:  Right  AIMS (if indicated):     Assets:  Resilience Social Support  ADL's:  Intact  Cognition:  WNL  Sleep:  Number of Hours: 6     Mental Status Per Nursing Assessment::   On Admission:  Suicidal ideation indicated by patient  Demographic Factors:  Gay, lesbian, or bisexual orientation  Loss Factors: NA  Historical Factors: NA  Risk Reduction Factors:   Positive social support  Continued Clinical Symptoms:  Previous Psychiatric Diagnoses and Treatments  Cognitive Features That Contribute To Risk:  None    Suicide Risk:  Minimal: No identifiable suicidal ideation.  Patients presenting with no risk factors but with morbid ruminations; may be classified as minimal  risk based on the severity of the depressive symptoms    Plan Of Care/Follow-up recommendations:  Activity:  Activity/if antidepressant increases suicidal thoughts to discontinue inform provider  Malvin Johns, MD 08/15/2019, 7:43 AM

## 2019-08-15 NOTE — Discharge Summary (Signed)
Physician Discharge Summary Note  Patient:  Edwin Burton is an 19 y.o., adult MRN:  [id number] DOB:  07/20/2000 Patient phone:  [phone] (home)  Patient address:   [address] [place] [address],  Total Time spent with patient: 45 minutes  Date of Admission:  08/14/2019 Date of Discharge: 08/15/19  Reason for Admission:   This is a 19 year old student presented voluntarily due to the severity of suicidal thoughts.  The patient had been on Lexapro and Abilify previously but is not on it now.  The patient acknowledges cannabis usage "about 1 time a week".  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  Further this is a transgender use, who cannot report why the depression was of recurrence other than being off of antidepressants. 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 Principal Problem: Suicidal ideation Discharge Diagnoses: Principal Problem:   Suicidal ideation Active Problems:   Anxiety state   Suicide (HCC)   MDD (major depressive disorder), severe (HCC)   Past Psychiatric History: Past treatment with Lexapro and Abilify  Past Medical History: History reviewed. No pertinent past medical history. History reviewed. No pertinent surgical history. Family History: History reviewed. No pertinent family history. Family Psychiatric  History: No new data Social History:  Social History   Substance and Sexual Activity  Alcohol Use None     Social History   Substance and Sexual Activity  Drug Use Not on file    Social History   Socioeconomic History  . Marital status: Single    Spouse name: Not on file  . Number of children: Not on file  . Years of education: Not on file  . Highest education level: Not on file  Occupational  History  . Not on file  Tobacco Use  . Smoking status: Never Smoker  . Smokeless tobacco: Never Used  Substance and Sexual Activity  . Alcohol use: Not on file  . Drug use: Not on file  . Sexual activity: Not on file  Other Topics Concern  . Not on file  Social History Narrative  . Not on file   Social Determinants of Health   Financial Resource Strain:   . Difficulty of Paying Living Expenses: Not on file  Food Insecurity:   . Worried About Programme researcher, broadcasting/film/video in the Last Year: Not on file  . Ran Out of Food in the Last Year: Not on file  Transportation Needs:   . Lack of Transportation (Medical): Not on file  . Lack of Transportation (Non-Medical): Not on file  Physical Activity:   . Days of Exercise per Week: Not on file  . Minutes of Exercise per Session: Not on file  Stress:   . Feeling of Stress : Not on file  Social Connections:   . Frequency of Communication with Friends and Family: Not on file  . Frequency of Social Gatherings with Friends and Family: Not on file  . Attends Religious Services: Not on file  . Active Member of Clubs or Organizations: Not on file  . Attends Banker Meetings: Not on file  . Marital Status: Not on file    Hospital Course:    Patient was admitted to observation and transferred to the 300 hall.  Patient slept well with temazepam,  fluoxetine was added as well as B vitamin augmentation.  By the morning of the third the patient reported no suicidal thoughts contract fully for safety and was eager to get back to school so patient was discharged on the meds listed below- This individual displayed no dangerous behaviors no psychosis no mania Physical Findings: AIMS: Facial and Oral Movements Muscles of Facial Expression: None, normal Lips and Perioral Area: None, normal Jaw: None, normal Tongue: None, normal,Extremity Movements Upper (arms, wrists, hands, fingers): None, normal Lower (legs, knees, ankles, toes): None, normal,  Trunk Movements Neck, shoulders, hips: None, normal, Overall Severity Severity of abnormal movements (highest score from questions above): None, normal Incapacitation due to abnormal movements: None, normal Patient's awareness of abnormal movements (rate only patient's report): No Awareness, Dental Status Current problems with teeth and/or dentures?: No Does patient usually wear dentures?: No  CIWA:  CIWA-Ar Total: 2 COWS:  COWS Total Score: 2  Musculoskeletal: Strength & Muscle Tone: within normal limits Gait & Station: normal Patient leans: N/A  Psychiatric Specialty Exam: Physical Exam  Review of Systems  Blood pressure (!) 129/116, pulse 92, temperature (!) 97.3 F (36.3 C), temperature source Oral, resp. rate 20, SpO2 100 %.There is no height or weight on file to calculate BMI.  General Appearance: Casual  Eye Contact:  Good  Speech:  Clear and Coherent  Volume:  Normal  Mood:  Euthymic  Affect:  Restricted  Thought Process:  Coherent and Goal Directed  Orientation:  Full (Time, Place, and Person)  Thought Content:  Tangential  Suicidal Thoughts:  No  Homicidal Thoughts:  No  Memory:  Immediate;   Fair Recent;   Fair Remote;   Fair  Judgement:  Fair  Insight:  Fair  Psychomotor Activity:  Normal  Concentration:  Concentration: Fair and Attention Span: Good  Recall:  [place] of Knowledge:  Fair  Language:  Good  Akathisia:  Negative  Handed:  Right  AIMS (if indicated):     Assets:  Resilience Social Support  ADL's:  Intact  Cognition:  WNL  Sleep:  Number of Hours: 6        Has this patient used any form of tobacco in the last 30 days? (Cigarettes, Smokeless Tobacco, Cigars, and/or Pipes) Yes, No  Blood Alcohol level:  Lab Results  Component Value Date   ETH <10 12/05/7626    Metabolic Disorder Labs:  Lab Results  Component Value Date   HGBA1C 4.8 08/14/2019   MPG 91.06 08/14/2019   No results found for: PROLACTIN Lab Results  Component  Value Date   CHOL 140 08/14/2019   TRIG 169 (H) 08/14/2019   HDL 45 08/14/2019   CHOLHDL 3.1 08/14/2019   VLDL 34 08/14/2019   [place] 61 08/14/2019    See Psychiatric Specialty Exam and Suicide Risk Assessment completed by Attending Physician prior to discharge.  Discharge destination:  Home  Is patient on multiple antipsychotic therapies at discharge:  No   Has Patient had three or more failed trials of antipsychotic monotherapy by history:  No  Recommended Plan for Multiple Antipsychotic Therapies: Additional reason(s) for multiple antispychotic treatment:  na/   Allergies as of 08/15/2019   No Known Allergies     Medication List    STOP taking these medications   ARIPiprazole 2 MG tablet Commonly known as: ABILIFY   escitalopram 10 MG tablet Commonly known as: LEXAPRO     TAKE these medications     Indication  EnLyte Caps 1  a day- if not covered by insurance go to [email or web address] to fill  Indication: Deficiency of Folic Acid   FLUoxetine 20 MG capsule Commonly known as: PROZAC Take 1 capsule (20 mg total) by mouth daily.  Indication: Depression   temazepam 30 MG capsule Commonly known as: RESTORIL Take 1 capsule (30 mg total) by mouth at bedtime as needed for sleep.  Indication: Trouble Sleeping       Signed: Malvin Johns, MD 08/15/2019, 7:39 AM

## 2019-08-15 NOTE — B[facility] Group Notes (Signed)
LCSW Group Therapy Note  08/15/2019 2:22 PM  Type of Therapy/Topic:  Group Therapy:  Balance in Life  Participation Level:  Did Not Attend  Description of Group:    This group will address the concept of balance and how it feels and looks when one is unbalanced. Patients will be encouraged to process areas in their lives that are out of balance and identify reasons for remaining unbalanced. Facilitators will guide patients in utilizing problem-solving interventions to address and correct the stressor making their life unbalanced. Understanding and applying boundaries will be explored and addressed for obtaining and maintaining a balanced life. Patients will be encouraged to explore ways to assertively make their unbalanced needs known to significant others in their lives, using other group members and facilitator for support and feedback.  Therapeutic Goals: 1. Patient will identify two or more emotions or situations they have that consume much of in their lives. 2. Patient will identify signs/triggers that life has become out of balance:  3. Patient will identify two ways to set boundaries in order to achieve balance in their lives:  4. Patient will demonstrate ability to communicate their needs through discussion and/or role plays  Summary of Patient Progress: x     Therapeutic Modalities:   Cognitive Behavioral Therapy Solution-Focused Therapy Assertiveness Training  Iris Pert, MSW, LCSW Clinical Social Work 08/15/2019 2:22 PM

## 2019-08-15 NOTE — BHH Counselor (Signed)
Adult Comprehensive Assessment  Patient ID: Edwin Burton, adult   DOB: 08-Aug-2000, 19 y.o.   MRN: [id number]  Patient is being discharged within 24 hours of admission. CSW unable to complete full assessment. Patient was referred to [facility] for therapy services. Patient will continue to follow up with his current provider for medication management. CSW will continue to follow for an appropriate discharge.     Summary/Recommendations:   Summary and Recommendations (to be completed by the evaluator): Patient is being discharged within 24 hours of admission. A "full" psycho-social assessment was not completed. Edwin Burton is a 19 year old transgender male who is diagnosed with Suicidal ideation. She presented to the hospital seeking treatment for suicidal ideation, worsening depression and medication stabilization. During the assessment, Edwin Burton was pleasant and cooperative with providing information. Edwin Burton reports she came to the hospital to "ensure her safety". She shared that she has experienced an increase in depression and anxiety, which led to her increasing suicidal ideation. Edwin Burton shared that she wants to be stabilized on medications and learn better coping skills while in the hospital. Edwin Burton can benefit from crisis stabilization, medication management, therapeutic milieu and referral services.  Edwin Burton. 08/15/2019

## 2019-08-15 NOTE — Tx Team (Signed)
Interdisciplinary Treatment and Diagnostic Plan Update  08/15/2019 Time of Session: 9:25am [name] MRN: [id number]  Principal Diagnosis: Suicidal ideation  Secondary Diagnoses: Principal Problem:   Suicidal ideation Active Problems:   Anxiety state   Suicide [facility])   MDD (major depressive disorder), severe ([place])   Current Medications:  Current Facility-Administered Medications  Medication Dose Route Frequency Provider Last Rate Last Admin  . acetaminophen (TYLENOL) tablet 650 mg  650 mg Oral Q6H PRN Rankin, Shuvon B, NP      . alum & mag hydroxide-simeth (MAALOX/MYLANTA) 200-200-20 MG/5ML suspension 30 mL  30 mL Oral Q4H PRN Rankin, Shuvon B, NP      . FLUoxetine (PROZAC) capsule 20 mg  20 mg Oral Daily Johnn Hai, MD   20 mg at 08/15/19 0759  . magnesium hydroxide (MILK OF MAGNESIA) suspension 30 mL  30 mL Oral Daily PRN Rankin, Shuvon B, NP      . prenatal multivitamin tablet 1 tablet  1 tablet Oral Q1200 Johnn Hai, MD   1 tablet at 08/14/19 1726  . temazepam (RESTORIL) capsule 30 mg  30 mg Oral QHS Johnn Hai, MD   30 mg at 08/14/19 2205   PTA Medications: Medications Prior to Admission  Medication Sig Dispense Refill Last Dose  . ARIPiprazole (ABILIFY) 2 MG tablet Take 2 mg by mouth daily.     Marland Kitchen escitalopram (LEXAPRO) 10 MG tablet Take 10 mg by mouth daily.       Patient Stressors:    Patient Strengths:    Treatment Modalities: Medication Management, Group therapy, Case management,  1 to 1 session with clinician, Psychoeducation, Recreational therapy.   Physician Treatment Plan for Primary Diagnosis: Suicidal ideation Long Term Goal(s): Improvement in symptoms so as ready for discharge Improvement in symptoms so as ready for discharge   Short Term Goals: Ability to maintain clinical measurements within normal limits will improve Compliance with prescribed medications will improve Ability to identify triggers associated with substance abuse/mental health  issues will improve Ability to identify changes in lifestyle to reduce recurrence of condition will improve Ability to verbalize feelings will improve Ability to disclose and discuss suicidal ideas  Medication Management: Evaluate patient's response, side effects, and tolerance of medication regimen.  Therapeutic Interventions: 1 to 1 sessions, Unit Group sessions and Medication administration.  Evaluation of Outcomes: Adequate for Discharge  Physician Treatment Plan for Secondary Diagnosis: Principal Problem:   Suicidal ideation Active Problems:   Anxiety state   Suicide ([place])   MDD (major depressive disorder), severe ([place])  Long Term Goal(s): Improvement in symptoms so as ready for discharge Improvement in symptoms so as ready for discharge   Short Term Goals: Ability to maintain clinical measurements within normal limits will improve Compliance with prescribed medications will improve Ability to identify triggers associated with substance abuse/mental health issues will improve Ability to identify changes in lifestyle to reduce recurrence of condition will improve Ability to verbalize feelings will improve Ability to disclose and discuss suicidal ideas     Medication Management: Evaluate patient's response, side effects, and tolerance of medication regimen.  Therapeutic Interventions: 1 to 1 sessions, Unit Group sessions and Medication administration.  Evaluation of Outcomes: Adequate for Discharge   RN Treatment Plan for Primary Diagnosis: Suicidal ideation Long Term Goal(s): Knowledge of disease and therapeutic regimen to maintain health will improve  Short Term Goals: Ability to participate in decision making will improve, Ability to verbalize feelings will improve, Ability to disclose and discuss suicidal ideas, Ability to  identify and develop effective coping behaviors will improve and Compliance with prescribed medications will improve  Medication Management: RN will  administer medications as ordered by provider, will assess and evaluate patient's response and provide education to patient for prescribed medication. RN will report any adverse and/or side effects to prescribing provider.  Therapeutic Interventions: 1 on 1 counseling sessions, Psychoeducation, Medication administration, Evaluate responses to treatment, Monitor vital signs and CBGs as ordered, Perform/monitor CIWA, COWS, AIMS and Fall Risk screenings as ordered, Perform wound care treatments as ordered.  Evaluation of Outcomes: Adequate for Discharge   LCSW Treatment Plan for Primary Diagnosis: Suicidal ideation Long Term Goal(s): Safe transition to appropriate next level of care at discharge, Engage patient in therapeutic group addressing interpersonal concerns.  Short Term Goals: Engage patient in aftercare planning with referrals and resources  Therapeutic Interventions: Assess for all discharge needs, 1 to 1 time with Social worker, Explore available resources and support systems, Assess for adequacy in community support network, Educate family and significant other(s) on suicide prevention, Complete Psychosocial Assessment, Interpersonal group therapy.  Evaluation of Outcomes: Adequate for Discharge   Progress in Treatment: Attending groups: No. Participating in groups: No. Taking medication as prescribed: Yes. Toleration medication: Yes. Family/Significant other contact made: No, will contact:  no one, patient declined consent Patient understands diagnosis: Yes. Discussing patient identified problems/goals with staff: Yes. Medical problems stabilized or resolved: Yes. Denies suicidal/homicidal ideation: Yes. Issues/concerns per patient self-inventory: No. Other:   New problem(s) identified: None   New Short Term/Long Term Goal(s): medication stabilization, elimination of SI thoughts, development of comprehensive mental wellness plan.    Patient Goals: "I came for my safety  and to get my meds straightened out. I also want to learn coping skills"   Discharge Plan or Barriers: Patient is discharging to her dorm on [place] [facility]. Patient will continue to follow up with Dr. Elmer Picker of [facility] for medication management. Patient was referred to [facility] for therapy services.   Reason for Continuation of Hospitalization: None   Estimated Length of Stay: Discharge, 08/15/2019  Attendees: Patient: Edwin Burton  08/15/2019 11:30 AM  Physician: Dr. Nehemiah Massed, MD 08/15/2019 11:30 AM  Nursing:  08/15/2019 11:30 AM  RN Care Manager: 08/15/2019 11:30 AM  Social Worker: Baldo Daub, LCSW 08/15/2019 11:30 AM  Recreational Therapist:  08/15/2019 11:30 AM  Other:  08/15/2019 11:30 AM  Other:  08/15/2019 11:30 AM  Other: 08/15/2019 11:30 AM    Scribe for Treatment Team: [name], LCSWA 08/15/2019 11:30 AM

## 2019-08-16 ENCOUNTER — Telehealth: Payer: Self-pay

## 2019-08-16 NOTE — Telephone Encounter (Signed)
Recd document from [facility]  Regarding transition and pronouns - forwarding by email to [facility] for decision of where to store document.

## 2020-07-29 ENCOUNTER — Ambulatory Visit (HOSPITAL_COMMUNITY)
Admission: EM | Admit: 2020-07-29 | Discharge: 2020-07-30 | Disposition: A | Discharge status: Other Acute Inpatient Hospital | Attending: Registered Nurse | Admitting: Registered Nurse

## 2020-07-29 ENCOUNTER — Encounter (HOSPITAL_COMMUNITY): Payer: Self-pay | Admitting: Registered Nurse

## 2020-07-29 ENCOUNTER — Ambulatory Visit (HOSPITAL_COMMUNITY)
Admission: RE | Admit: 2020-07-29 | Discharge: 2020-07-29 | Disposition: A | Discharge status: Home / Self Care | Attending: Psychiatry | Admitting: Psychiatry

## 2020-07-29 ENCOUNTER — Other Ambulatory Visit: Payer: Self-pay

## 2020-07-29 DIAGNOSIS — Z20822 Contact with and (suspected) exposure to covid-19: Secondary | ICD-10-CM | POA: Insufficient documentation

## 2020-07-29 DIAGNOSIS — F411 Generalized anxiety disorder: Secondary | ICD-10-CM | POA: Diagnosis present

## 2020-07-29 DIAGNOSIS — R45851 Suicidal ideations: Secondary | ICD-10-CM | POA: Diagnosis present

## 2020-07-29 DIAGNOSIS — F332 Major depressive disorder, recurrent severe without psychotic features: Secondary | ICD-10-CM | POA: Insufficient documentation

## 2020-07-29 DIAGNOSIS — F419 Anxiety disorder, unspecified: Secondary | ICD-10-CM | POA: Insufficient documentation

## 2020-07-29 DIAGNOSIS — F322 Major depressive disorder, single episode, severe without psychotic features: Secondary | ICD-10-CM

## 2020-07-29 DIAGNOSIS — Z79899 Other long term (current) drug therapy: Secondary | ICD-10-CM | POA: Insufficient documentation

## 2020-07-29 LAB — RESP PANEL BY RT-PCR (FLU A&B, COVID) ARPGX2
Influenza A by PCR: NEGATIVE
Influenza B by PCR: NEGATIVE
SARS Coronavirus 2 by RT PCR: NEGATIVE

## 2020-07-29 LAB — CBC WITH DIFFERENTIAL/PLATELET
Abs Immature Granulocytes: 0.03 10*3/uL (ref 0.00–0.07)
Basophils Absolute: 0 10*3/uL (ref 0.0–0.1)
Basophils Relative: 1 %
Eosinophils Absolute: 0.1 10*3/uL (ref 0.0–0.5)
Eosinophils Relative: 1 %
HCT: 42.5 % (ref 39.0–52.0)
Hemoglobin: 14 g/dL (ref 13.0–17.0)
Immature Granulocytes: 0 %
Lymphocytes Relative: 31 %
Lymphs Abs: 2.3 10*3/uL (ref 0.7–4.0)
MCH: 29.4 pg (ref 26.0–34.0)
MCHC: 32.9 g/dL (ref 30.0–36.0)
MCV: 89.3 fL (ref 80.0–100.0)
Monocytes Absolute: 0.4 10*3/uL (ref 0.1–1.0)
Monocytes Relative: 6 %
Neutro Abs: 4.5 10*3/uL (ref 1.7–7.7)
Neutrophils Relative %: 61 %
Platelets: 285 10*3/uL (ref 150–400)
RBC: 4.76 MIL/uL (ref 4.22–5.81)
RDW: 11.9 % (ref 11.5–15.5)
WBC: 7.4 10*3/uL (ref 4.0–10.5)
nRBC: 0 % (ref 0.0–0.2)

## 2020-07-29 LAB — LIPID PANEL
Cholesterol: 150 mg/dL (ref 0–200)
HDL: 54 mg/dL (ref >40)
LDL Cholesterol: 81 mg/dL (ref 0–99)
Total CHOL/HDL Ratio: 2.8 RATIO
Triglycerides: 74 mg/dL (ref <150)
VLDL: 15 mg/dL (ref 0–40)

## 2020-07-29 LAB — COMPREHENSIVE METABOLIC PANEL
ALT: 16 U/L (ref 0–44)
AST: 19 U/L (ref 15–41)
Albumin: 4.1 g/dL (ref 3.5–5.0)
Alkaline Phosphatase: 34 U/L — ABNORMAL LOW (ref 38–126)
Anion gap: 11 (ref 5–15)
BUN: 12 mg/dL (ref 6–20)
CO2: 27 mmol/L (ref 22–32)
Calcium: 9.4 mg/dL (ref 8.9–10.3)
Chloride: 100 mmol/L (ref 98–111)
Creatinine, Ser: 0.83 mg/dL (ref 0.61–1.24)
GFR, Estimated: >60 mL/min (ref >60)
Glucose, Bld: 115 mg/dL — ABNORMAL HIGH (ref 70–99)
Potassium: 4.3 mmol/L (ref 3.5–5.1)
Sodium: 138 mmol/L (ref 135–145)
Total Bilirubin: 0.3 mg/dL (ref 0.3–1.2)
Total Protein: 6.7 g/dL (ref 6.5–8.1)

## 2020-07-29 LAB — URINALYSIS, ROUTINE W REFLEX MICROSCOPIC
Bilirubin Urine: NEGATIVE
Glucose, UA: NEGATIVE mg/dL
Hgb urine dipstick: NEGATIVE
Ketones, ur: NEGATIVE mg/dL
Leukocytes,Ua: NEGATIVE
Nitrite: NEGATIVE
Protein, ur: NEGATIVE mg/dL
Specific Gravity, Urine: 1.025 (ref 1.005–1.030)
pH: 5 (ref 5.0–8.0)

## 2020-07-29 LAB — POCT URINE DRUG SCREEN - MANUAL ENTRY (I-SCREEN)
POC Amphetamine UR: NOT DETECTED
POC Buprenorphine (BUP): NOT DETECTED
POC Cocaine UR: NOT DETECTED
POC Marijuana UR: POSITIVE — AB
POC Methadone UR: NOT DETECTED
POC Methamphetamine UR: NOT DETECTED
POC Morphine: NOT DETECTED
POC Oxazepam (BZO): NOT DETECTED
POC Oxycodone UR: NOT DETECTED
POC Secobarbital (BAR): NOT DETECTED

## 2020-07-29 LAB — ETHANOL: Alcohol, Ethyl (B): <10 mg/dL

## 2020-07-29 LAB — MAGNESIUM: Magnesium: 2.1 mg/dL (ref 1.7–2.4)

## 2020-07-29 LAB — TSH: TSH: 0.847 u[IU]/mL (ref 0.350–4.500)

## 2020-07-29 LAB — POC SARS CORONAVIRUS 2 AG -  ED: SARS Coronavirus 2 Ag: NEGATIVE

## 2020-07-29 LAB — POC SARS CORONAVIRUS 2 AG: SARS Coronavirus 2 Ag: NEGATIVE

## 2020-07-29 MED ORDER — HYDROXYZINE HCL 25 MG PO TABS
50.0000 mg | ORAL_TABLET | Freq: Every day | ORAL | Status: DC | PRN
  Administered 2020-07-29: 50 mg via ORAL
  Filled 2020-07-29: qty 2

## 2020-07-29 MED ORDER — FLUOXETINE HCL 20 MG PO CAPS
20.0000 mg | ORAL_CAPSULE | Freq: Every day | ORAL | Status: DC
  Administered 2020-07-30: 20 mg via ORAL
  Filled 2020-07-29: qty 1

## 2020-07-29 MED ORDER — ACETAMINOPHEN 325 MG PO TABS: 650.0000 mg | ORAL_TABLET | Freq: Four times a day (QID) | ORAL | Status: DC | PRN

## 2020-07-29 MED ORDER — ALUM & MAG HYDROXIDE-SIMETH 200-200-20 MG/5ML PO SUSP: 30.0000 mL | ORAL | Status: DC | PRN

## 2020-07-29 MED ORDER — SPIRONOLACTONE 25 MG PO TABS
50.0000 mg | ORAL_TABLET | Freq: Two times a day (BID) | ORAL | Status: DC
  Administered 2020-07-29 – 2020-07-30 (×2): 50 mg via ORAL
  Filled 2020-07-29 (×2): qty 2

## 2020-07-29 MED ORDER — BACITRACIN-NEOMYCIN-POLYMYXIN 400-5-5000 EX OINT
1.0000 "application " | TOPICAL_OINTMENT | Freq: Every day | CUTANEOUS | Status: DC
  Administered 2020-07-29 – 2020-07-30 (×2): 1 via TOPICAL
  Filled 2020-07-29: qty 1
  Filled 2020-07-29: qty 3
  Filled 2020-07-29: qty 1

## 2020-07-29 MED ORDER — MAGNESIUM HYDROXIDE 400 MG/5ML PO SUSP: 30.0000 mL | Freq: Every day | ORAL | Status: DC | PRN

## 2020-07-29 MED ORDER — TRAZODONE HCL 50 MG PO TABS
50.0000 mg | ORAL_TABLET | Freq: Every evening | ORAL | Status: DC | PRN
  Administered 2020-07-29: 50 mg via ORAL
  Filled 2020-07-29: qty 1

## 2020-07-29 MED ORDER — LURASIDONE HCL 40 MG PO TABS
40.0000 mg | ORAL_TABLET | Freq: Every day | ORAL | Status: DC
  Administered 2020-07-29 – 2020-07-30 (×2): 40 mg via ORAL
  Filled 2020-07-29 (×2): qty 1

## 2020-07-29 NOTE — H&P (Signed)
Behavioral Health Medical Screening Exam  Edwin Burton is an 20 y.o. adult., who identifies as a trans male. Presented to [facility] as a walk-in requesting admission for suicidal ideation with a plan to use carbon monoxide fumes to end her life. She stated she saw her psychiatrist, virtually, today and was told to go to the hospital. 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 She waslast admitted to [facility] in March 2021. Her only other hospitalization was in the 9th grade. She stated she takes her medications every day but can't recall when the last dose change was. She is currently taking Prozac, Latuda, Clonazepam as needed and hydroxyzine for her mental health medications, does not recall the dosages. She stated she has  her medications with her and also takes spironolactone and estradiol patches for hormone therapy. She is tearful and is unable to contract for safety. She has had several suicide attempts, last being about a year ago.  Total Time spent with patient: 30 minutes  Psychiatric Specialty Exam: Physical Exam HENT:     Head: Normocephalic.  Musculoskeletal:        General: Normal range of motion.     Cervical back: Normal range of motion.  Neurological:     Mental Status: She is alert and oriented to person, place, and time.    Review of Systems  Constitutional: Negative.   HENT: Negative.   Cardiovascular: Negative.   Gastrointestinal: Negative.   Respiratory: Negative for chest tightness and shortness of breath.   Neurological: Negative for facial asymmetry and headaches.   General Appearance: Casual and Fairly Groomed Eye Contact:  Good Speech:  Clear and Coherent and Normal Rate Volume:  Normal Mood:  Anxious, Depressed and Hopeless Affect:  Congruent, Depressed and Tearful Thought Process:  Coherent, Goal Directed and Descriptions of Associations: Intact Orientation:  Full (Time, Place, and Person) Thought Content:  Logical and Hallucinations: None Suicidal Thoughts:   Yes.  with intent/plan Homicidal Thoughts:  No Memory:  Immediate;   Good Recent;   Good Remote;   Good Judgement:  Intact Insight:  Present Psychomotor Activity:  Normal Concentration: Concentration: Fair and Attention Span: Fair Recall:  Good Fund of Knowledge:Good Language: Good Akathisia:  No Handed:  Right AIMS (if indicated):    Assets:  Communication Skills Desire for Improvement Financial Resources/Insurance Housing Resilience Social Support Transportation Vocational/Educational Sleep:     Musculoskeletal: Strength & Muscle Tone: within normal limits Gait & Station: normal Patient leans: N/A  There were no vitals taken for this visit.  Recommendations: Based on my evaluation the patient does not appear to have an emergency medical condition. Patient accepted to [facility] for overnight observation and reassessment in the morning.   Laveda Abbe, NP 07/29/2020, 4:15 PM

## 2020-07-29 NOTE — ED Notes (Signed)
Pt sleeping@this time. Breathing even and unlabored. Will continue to monitor for safety 

## 2020-07-29 NOTE — ED Notes (Addendum)
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 Pt states being under a lot of stress lately and unable to contract for safety. Pt have self mutilated lacerations to left and right thighs. Pt reports cutting self "about a week ago". Areas appear to have started to scab over. Some areas with superficial lacerations. Admitted to continuous assessment for overnight obs. Oriented to unit and rules. Will monitor for safety.

## 2020-07-29 NOTE — ED Provider Notes (Signed)
Behavioral Health Admission H&P [facility] & OBS)  Date: 07/29/20 Patient Name: Edwin Burton MRN: [id number] Chief Complaint: No chief complaint on file.     Diagnoses:  Final diagnoses:  Anxiety state  Suicidal ideation  MDD (major depressive disorder), severe ([facility])    HPI: Edwin Burton, 20 y.o., adult patient presents to [facility] for direct admit From [facility] where patient had presented as a walk in with complaints depression and suicidal ideation.   Per Behavior Health Medical Screening Exam:  Reviewed by this provider:  Johnmatthew Burton is an 20 y.o. adult., who identifies as a trans male. Presented to [facility] as a walk-in requesting admission for suicidal ideation with a plan to use carbon monoxide fumes to end her life. She stated she saw her psychiatrist, virtually, today and was told to go to the hospital. 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 She was last admitted to [facility] in March 2021. Her only other hospitalization was in the 9th grade. She stated she takes her medications every day but can't recall when the last dose change was. She is currently taking Prozac, Latuda, Clonazepam as needed and hydroxyzine for her mental health medications, does not recall the dosages. She stated she has  her medications with her and also takes spironolactone and estradiol patches for hormone therapy. She is tearful and is unable to contract for safety. She has had several suicide attempts, last being about a year ago. Patient seen face to face by this provider, consulted with Dr. Bronwen Betters; and chart reviewed on 07/29/20.  On evaluation Edwin Burton continues to endorse suicidal ideation.  Patient reports had a virtual appointment with psychiatrist today and was told to go to hospital.  Patient unable to contract for safety; admitted to continuous assessment for safety and stabilization.     PHQ 2-9:   Flowsheet Row ED from 07/29/2020 in [facility] Admission  (Discharged) [facility]  C-SSRS RISK CATEGORY High Risk High Risk       Total Time spent with patient: 45 minutes  Musculoskeletal  Strength & Muscle Tone: within normal limits Gait & Station: normal Patient leans: N/A  Psychiatric Specialty Exam  Presentation General Appearance: Appropriate for Environment; Casual  Eye Contact:No data recorded Speech:Clear and Coherent; Normal Rate  Speech Volume:Normal  Handedness:Right   Mood and Affect  Mood:Depressed  Affect:Appropriate   Thought Process  Thought Processes:No data recorded Descriptions of Associations:Intact  Orientation:Full (Time, Place and Person)  Thought Content:WDL  Hallucinations:Hallucinations: None  Ideas of Reference:None  Suicidal Thoughts:Suicidal Thoughts: Yes, Active SI Active Intent and/or Plan: With Intent; With Plan  Homicidal Thoughts:Homicidal Thoughts: No   Sensorium  Memory:Immediate Good; Recent Good  Judgment:Intact  Insight:No data recorded  Executive Functions  Concentration:Good  Attention Span:Good  Recall:Good  Fund of Knowledge:Good  Language:Good   Psychomotor Activity  Psychomotor Activity:Psychomotor Activity: Normal   Assets  Assets:Communication Skills; Desire for Improvement; Housing   Sleep  Sleep:Sleep: Good   Physical Exam Vitals and nursing note reviewed. Exam conducted with a chaperone present.  Constitutional:      General: She is not in acute distress.    Appearance: Normal appearance. She is not ill-appearing.  HENT:     Head: Normocephalic.  Eyes:     Pupils: Pupils are equal, round, and reactive to light.  Cardiovascular:     Rate and Rhythm: Normal rate.  Pulmonary:     Effort: Pulmonary effort is  normal.  Musculoskeletal:        General: Normal range of motion.     Cervical back: Normal range of motion.  Skin:    General: Skin is warm and dry.  Neurological:      Mental Status: She is alert and oriented to person, place, and time.  Psychiatric:        Attention and Perception: Attention and perception normal. She does not perceive auditory or visual hallucinations.        Mood and Affect: Mood and affect normal.        Speech: Speech normal.        Behavior: Behavior is uncooperative.        Thought Content: Thought content is not paranoid or delusional. Thought content does not include homicidal ideation. Suicidal: Reporting suicidal ideation but laughing while saying it.        Cognition and Memory: Cognition and memory normal.        Judgment: Judgment is impulsive.    Review of Systems  Constitutional: Negative.   HENT: Negative.   Eyes: Negative.   Respiratory: Negative.   Cardiovascular: Negative.   Gastrointestinal: Negative.   Genitourinary: Negative.   Musculoskeletal: Negative.   Skin: Negative.   Neurological: Negative.   Endo/Heme/Allergies: Negative.   Psychiatric/Behavioral: Positive for depression and suicidal ideas. Negative for hallucinations. The patient is nervous/anxious.        Patient continues to endorse suicidal ideation; when asked the reason why wanting to kill self; patient states "I don't know; there really isn't a reason."   Patient also laughed with started to discuss suicidal ideation    Blood pressure 134/82, pulse 84, temperature 98.4 F (36.9 C), temperature source Oral, SpO2 99 %. There is no height or weight on file to calculate BMI.  Past Psychiatric History: See above   Is the patient at risk to self? Yes  Has the patient been a risk to self in the past 6 months? Yes .    Has the patient been a risk to self within the distant past? Yes   Is the patient a risk to others? No   Has the patient been a risk to others in the past 6 months? No   Has the patient been a risk to others within the distant past? No   Past Medical History: History reviewed. No pertinent past medical history. History reviewed. No  pertinent surgical history.  Family History: History reviewed. No pertinent family history.  Social History:  Social History   Socioeconomic History  . Marital status: Single    Spouse name: Not on file  . Number of children: Not on file  . Years of education: Not on file  . Highest education level: Not on file  Occupational History  . Not on file  Tobacco Use  . Smoking status: Never Smoker  . Smokeless tobacco: Never Used  Substance and Sexual Activity  . Alcohol use: Not on file  . Drug use: Not on file  . Sexual activity: Not on file  Other Topics Concern  . Not on file  Social History Narrative  . Not on file   Social Determinants of Health   Financial Resource Strain: Not on file  Food Insecurity: Not on file  Transportation Needs: Not on file  Physical Activity: Not on file  Stress: Not on file  Social Connections: Not on file  Intimate Partner Violence: Not on file    SDOH:  SDOH Screenings   Alcohol  Screen: Low Risk   . Last Alcohol Screening Score (AUDIT): 6  Depression (PHQ2-9): Not on file  Financial Resource Strain: Not on file  Food Insecurity: Not on file  Housing: Not on file  Physical Activity: Not on file  Social Connections: Not on file  Stress: Not on file  Tobacco Use: Low Risk   . Smoking Tobacco Use: Never Smoker  . Smokeless Tobacco Use: Never Used  Transportation Needs: Not on file    Last Labs:  Admission on 07/29/2020  Component Date Value Ref Range Status  . SARS Coronavirus 2 Ag 07/29/2020 Negative  Negative Final  . POC Amphetamine UR 07/29/2020 None Detected  NONE DETECTED (Cut Off Level 1000 ng/mL) Final  . POC Secobarbital (BAR) 07/29/2020 None Detected  NONE DETECTED (Cut Off Level 300 ng/mL) Final  . POC Buprenorphine (BUP) 07/29/2020 None Detected  NONE DETECTED (Cut Off Level 10 ng/mL) Final  . POC Oxazepam (BZO) 07/29/2020 None Detected  NONE DETECTED (Cut Off Level 300 ng/mL) Final  . POC Cocaine UR 07/29/2020 None  Detected  NONE DETECTED (Cut Off Level 300 ng/mL) Final  . POC Methamphetamine UR 07/29/2020 None Detected  NONE DETECTED (Cut Off Level 1000 ng/mL) Final  . POC Morphine 07/29/2020 None Detected  NONE DETECTED (Cut Off Level 300 ng/mL) Final  . POC Oxycodone UR 07/29/2020 None Detected  NONE DETECTED (Cut Off Level 100 ng/mL) Final  . POC Methadone UR 07/29/2020 None Detected  NONE DETECTED (Cut Off Level 300 ng/mL) Final  . POC Marijuana UR 07/29/2020 Positive* NONE DETECTED (Cut Off Level 50 ng/mL) Final  . SARS Coronavirus 2 Ag 07/29/2020 NEGATIVE  NEGATIVE Final   Comment: (NOTE) SARS-CoV-2 antigen NOT DETECTED.   Negative results are presumptive.  Negative results do not preclude SARS-CoV-2 infection and should not be used as the sole basis for treatment or other patient management decisions, including infection  control decisions, particularly in the presence of clinical signs and  symptoms consistent with COVID-19, or in those who have been in contact with the virus.  Negative results must be combined with clinical observations, patient history, and epidemiological information. The expected result is Negative.  Fact Sheet for Patients: https://www.jennings-kim.com/  Fact Sheet for Healthcare Providers: https://Gregorey-rogers.biz/  This test is not yet approved or cleared by the [place] FDA and  has been authorized for detection and/or diagnosis of SARS-CoV-2 by FDA under an Emergency Use Authorization (EUA).  This EUA will remain in effect (meaning this test can be used) for the duration of  the COV                          ID-19 declaration under Section 564(b)(1) of the Act, 21 U.S.C. section 360bbb-3(b)(1), unless the authorization is terminated or revoked sooner.      Allergies: Patient has no known allergies.  PTA Medications: (Not in a hospital admission)   Medical Decision Making  Patient admitted to Continuous Assessment  Unit  Lab Orders     Resp Panel by RT-PCR (Flu A&B, Covid) Nasopharyngeal Swab     CBC with Differential/Platelet     Comprehensive metabolic panel     Hemoglobin N5A     Magnesium     Ethanol     Lipid panel     TSH     Urinalysis, Routine w reflex microscopic Urine, Clean Catch     POC SARS Coronavirus 2 Ag-ED -     POCT Urine Drug  Screen - (ICup)     POC SARS Coronavirus 2 Ag   No medications ordered at this time; Nursing in the process or reviewing mediations with patient and entering into system.  Will order home medications once reviewed.      Recommendations  Based on my evaluation the patient does not appear to have an emergency medical condition.  Ailine Hefferan, NP 07/29/20  6:22 PM

## 2020-07-29 NOTE — ED Notes (Signed)
Pt calm and cooperative. she endorses SI. Pt has cuts on left thigh that was done by her cutting with a razor. Pt has no c/o pain or distress. Will continue to monitor for safety

## 2020-07-29 NOTE — BH Assessment (Addendum)
Comprehensive Clinical Assessment (CCA) Note  Edwin Burton is an 20 y.o. whom identifies as transgender (male to male). Patient prefers (she/her pronouns) [facility], voluntarily. Referred by psychiatrist. States she was seen by psychiatrist, "Edwin Burton", virtually today. Patient reported to psychiatrist intrusive suicidal thoughts and [facility]. Patient has a plan to use carbon monoxide fumes to end her life. When asked asked about access to means stated, "It's a [employer] around the corner, so yes". Denies access to firearms stating, "If I had a gun I would have been killed myself". Current suicidal thoughts triggered by "my manic episodes" of recently participating in "fun crimes". The fun crimes were described as shop lifting, stealing, "going places I shouldn't be". Patient has tried to commit suicide multiple times in the past (hanging self and overdoses). States that the trigger for the past attempts were due to "My brain is shitty, shitty, shitty, shitty".  Depressive symptoms reported were guilt, tearfulness, wanting to be alone, unable to get out of the bed, worthlessness, etc.). Appetite is poor: "My diet consist of THC, junk food, water". Sleeps 5-6 hrs per night. Patient reports severe anxiety and frequent panic attacks. Support system is low due to family members not being local. Mother and siblings live in [place] and/or [place]. She is a sophomore at [employer] and lives alone (with a cat). Current employed at [employer]. [facility] due to a similar presentation as today. Her only other hospitalization was in the 9th grade. She stated she takes her medications every day but can't recall when the last dose change was. She is currently taking Prozac, Latuda, Clonazepam as needed and hydroxyzine for her mental health medications, does not recall the dosages. Denies HI and AVH's. Patient  denies history of aggression. Denies legal issues. However, reports drug use of THC daily. Additional drugs used intermittently were reported as "Whippets", Mushroom, Poppers".  Disposition: Per Dr. Carolin Burton/Edwin Burton, patient [facility]. Edwin Calkinsravis Money, NP, approved the admission. Clinician contact nursing Edwin Burton([place], NP) to provide report, clinicals, etc. Safe Transport arranged along with a sitter escort [facility].        07/29/2020 Edwin Burton [id number]  Chief Complaint:  Chief Complaint  Patient presents with  . Psychiatric Evaluation   Visit Diagnosis: Major Depressive Disorder, Recurrent, Severe, without psychotic features; Anxiety Disorder; Substance use Disorder   CCA Screening, Triage and Referral (STR)  Patient Reported Information How did you hear about us? Self  Referral name: No data recorded Referral phone number: No data recorded  Whom do you see for routine medical problems? -- (unk)  Practice/Facility Name: No data recorded Practice/Facility Phone Number: No data recorded Name of Contact: No data recorded Contact Number: No data recorded Contact Fax Number: No data recorded Prescriber Name: No data recorded Prescriber Address (if known): No data recorded  What Is the Reason for Your Visit/Call Today? Suicidal Ideations with plan commit suicide by Carbon Monoxide Poisoning, Depression, Self Harm, "I feel like shit"  How Long Has This Been Causing You Problems? > than 6 months  What Do You Feel Would Help You the Most Today? Medication; Therapy; Other (Comment) (Inpatient psychiatric treatment)   Have You Recently Been in Any Inpatient Treatment (Hospital/Detox/Crisis Center/28-Day Program)? No  Name/Location of Program/Hospital:No data recorded How Long Were You There? No data recorded When Were You Discharged?  No data recorded  Have You Ever Received Services From  Hospital  Before? Yes  Who Do You See at [facility]? 08/14/2019 [facility]patient [facility]   Have You Recently Had Any Thoughts About Hurting Yourself? Yes  Are You Planning to Commit Suicide/Harm Yourself At This time? Yes   Have you Recently Had Thoughts About Hurting Someone Edwin Burton? No  Explanation: No data recorded  Have You Used Any Alcohol or Drugs in the Past 24 Hours? Yes  How Long Ago Did You Use Drugs or Alcohol? 0000 (on-going use of substances)  What Did You Use and How Much? "Whippets", Acid, Mushrooms, Nitrite "Poppers", THC   Do You Currently Have a Therapist/Psychiatrist? No  Name of Therapist/Psychiatrist: No data recorded  Have You Been Recently Discharged From Any Office Practice or Programs? No  Explanation of Discharge From Practice/Program: No data recorded    CCA Screening Triage Referral Assessment Type of Contact: Face-to-Face  Is this Initial or Reassessment? Initial Assessment  Date Telepsych consult ordered in [facility]:  07/29/2020  Time Telepsych consult ordered in [facility]:  No data recorded  Patient Reported Information Reviewed? Yes  Patient Left Without Being Seen? No data recorded Reason for Not Completing Assessment: No data recorded  Collateral Involvement: no collateral   Does Patient Have a Court Appointed Legal Guardian? No data recorded Name and Contact of Legal Guardian: No data recorded If Minor and Not Living with Parent(s), Who has Custody? No data recorded Is CPS involved or ever been involved? Never  Is APS involved or ever been involved? Never   Patient Determined To Be At Risk for Harm To Self or Others Based on Review of Patient Reported Information or Presenting Complaint? Yes, for Self-Harm  Method: No data recorded Availability of Means: No data recorded Intent: No data recorded Notification Required: No data recorded Additional Information for Danger to Others Potential: No data recorded Additional Comments for  Danger to Others Potential: No data recorded Are There Guns or Other Weapons in Your Home? No data recorded Types of Guns/Weapons: No data recorded Are These Weapons Safely Secured?                            No data recorded Who Could Verify You Are Able To Have These Secured: No data recorded Do You Have any Outstanding Charges, Pending Court Dates, Parole/Probation? No data recorded Contacted To Inform of Risk of Harm To Self or Others: No data recorded  [facility] of Assessment: -- ([facility])   Does Patient Present under Involuntary Commitment? No  IVC Papers Initial File Date: No data recorded  [place] of Residence: [place]   Patient Currently Receiving the Following Services: Individual Therapy; Medication Management   Determination of Need: Emergent (2 hours)   Options For Referral: Inpatient Hospitalization; Other: Comment ([facility]-observation unit)     CCA Biopsychosocial Intake/Chief Complaint:  Suicidal with plan, Depression, Self harm  Current Symptoms/Problems: Suicidal with plan, Depression, Self harm   Patient Reported Schizophrenia/Schizoaffective Diagnosis in Past: No   Strengths: communicates symptoms very well  Preferences: unk  Abilities: unk   Type of Services Patient Feels are Needed: unk   Initial Clinical Notes/Concerns: unk   Mental Health Symptoms Depression:  Difficulty Concentrating; Hopelessness; Change in energy/activity; Irritability; Weight gain/loss; Tearfulness; Worthlessness; Increase/decrease in appetite   Duration of Depressive symptoms: Less than two weeks   Mania:  Change in energy/activity; Irritability; Recklessness   Anxiety:  Irritability; Restlessness; Difficulty concentrating   Psychosis:  None   Duration of Psychotic symptoms: No data recorded  Trauma:  None   Obsessions:  Cause anxiety; Poor insight   Compulsions:  Poor Insight   Inattention:  N/A   Hyperactivity/Impulsivity:  Feeling of  restlessness   Oppositional/Defiant Behaviors:  Easily annoyed   Emotional Irregularity:  Chronic feelings of emptiness; Mood lability   Other Mood/Personality Symptoms:  No data recorded   Mental Status Exam Appearance and self-care  Stature:  Small   Weight:  Average weight   Clothing:  No data recorded  Grooming:  Normal   Cosmetic use:  None   Posture/gait:  Normal   Motor activity:  No data recorded  Sensorium  Attention:  Normal   Concentration:  Normal   Orientation:  Time; Situation; Place; Person; Object   Recall/memory:  Normal   Affect and Mood  Affect:  Depressed   Mood:  Anxious   Relating  Eye contact:  Normal   Facial expression:  Depressed   Attitude toward examiner:  Guarded; Dramatic; Irritable   Thought and Language  Speech flow: Normal   Thought content:  Appropriate to Mood and Circumstances   Preoccupation:  Suicide; Ruminations; Guilt   Hallucinations:  None   Organization:  No data recorded  [employer] of Knowledge:  Average   Intelligence:  Average   Abstraction:  Normal   Judgement:  Normal   Reality Testing:  Adequate   Insight:  Lacking   Decision Making:  Confused; Impulsive   Social Functioning  Social Maturity:  Impulsive   Social Judgement:  Normal   Stress  Stressors:  -- ("My manic behaviors"; "Fun Crimes like hop lifting or going places I have no business going".)   Coping Ability:  Overwhelmed; Deficient supports   Skill Deficits:  Self-control; Decision making   Supports:  Support needed     Religion: Religion/Spirituality Are You A Religious Person?:  (unk) How Might This Affect Treatment?: unk  Leisure/Recreation: Leisure / Recreation Do You Have Hobbies?:  (unk)  Exercise/Diet: Exercise/Diet Do You Exercise?:  (unk) Have You Gained or Lost A Significant Amount of Weight in the Past Six Months?:  (Patient states, "I live off of THC junk food, and water") Do You Follow  a Special Diet?: No Do You Have Any Trouble Sleeping?:  (5-6 hrs per night)   CCA Employment/Education Employment/Work Situation: Employment / Work Situation Employment situation: Employed 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 How long has patient been employed?: unk Patient's job has been impacted by current illness: No (unk) What is the longest time patient has a held a job?: unk 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 Has patient ever been in the [employer]?: No  Education: Education Is Patient Currently Attending School?: Yes School Currently Attending: [employer]-2nd year Last Grade Completed:  (first year of college) Name of High School: unk Did Garment/textile technologist From [employer]?: Yes Did Theme park manager?: Yes What Type of College Degree Do you Have?: Immunologist Did You Attend Graduate School?: No Did You Have Any Special Interests In School?: unk Did You Have An Individualized Education Program (IIEP): No Did You Have Any Difficulty At School?: Yes Were Any Medications Ever Prescribed For These Difficulties?: Yes Patient's Education Has Been Impacted by Current Illness: No   CCA Family/Childhood History Family and Relationship History: Family history Marital status: Single Are you sexually active?: Yes (unk) Does patient have children?:  No  Childhood History:  Childhood History By whom was/is the patient raised?: Mother Additional childhood history information: none reported Description of patient's relationship with caregiver when they were a child: none reported Patient's description of current relationship with people who raised him/her: none reported How were you disciplined when you got in trouble as a child/adolescent?: none reported Does patient have siblings?: Yes Number of Siblings:  (2; brother and sister in [place], [place]) Did patient suffer any verbal/emotional/physical/sexual abuse as a child?: No Did patient suffer from severe  childhood neglect?: No Has patient ever been sexually abused/assaulted/raped as an adolescent or adult?: No Was the patient ever a victim of a crime or a disaster?: No Witnessed domestic violence?: No Has patient been affected by domestic violence as an adult?: No  Child/Adolescent Assessment:     CCA Substance Use Alcohol/Drug Use: Alcohol / Drug Use Pain Medications: None Prescriptions: SEE MAR Over the Counter: SEE MAR History of alcohol / drug use?: Yes Substance #1 Name of Substance 1: THC 1 - Age of First Use: started using in the 7th grade 1 - Amount (size/oz): "1 bowl" 1 - Frequency: daily 1 - Duration: on-going 1 - Last Use / Amount: "last night" 1- Route of Use: smokes/inhalation Substance #2 Name of Substance 2: Patient also reports use of "Whippets" Nitroxide, Mushrooms, "Poppers" 2 - Age of First Use: teens 2 - Amount (size/oz): Varies 2 - Frequency: States he uses intermittently with sexual partners 2 - Duration: on-going 2 - Last Use / Amount: "A couple of days ago"                     ASAM's:  Six Dimensions of Multidimensional Assessment  Dimension 1:  Acute Intoxication and/or Withdrawal Potential:      Dimension 2:  Biomedical Conditions and Complications:      Dimension 3:  Emotional, Behavioral, or Cognitive Conditions and Complications:     Dimension 4:  Readiness to Change:     Dimension 5:  Relapse, Continued use, or Continued Problem Potential:     Dimension 6:  Recovery/Living Environment:     ASAM Severity Score:    ASAM Recommended Level of Treatment:     Substance use Disorder (SUD)    Recommendations for Services/Supports/Treatments:    DSM5 Diagnoses: Patient Active Problem List   Diagnosis Date Noted  . Anxiety state 08/14/2019  . Suicide (HCC) 08/14/2019  . Suicidal ideation 08/14/2019  . MDD (major depressive disorder), severe (HCC) 08/14/2019    Patient Centered Plan: Patient is on the following Treatment  Plan(s):  Anxiety, Depression and Substance Abuse   Referrals to Alternative Service(s): Referred to Alternative Service(s):   Place:   Date:   Time:    Referred to Alternative Service(s):   Place:   Date:   Time:    Referred to Alternative Service(s):   Place:   Date:   Time:    Referred to Alternative Service(s):   Place:   Date:   Time:     Melynda Ripple, CounselorComprehensive Clinical Assessment (CCA) Screening, Triage and Referral Note  07/29/2020 Edwin Burton [id number]  Chief Complaint:  Chief Complaint  Patient presents with  . Psychiatric Evaluation   Visit Diagnosis: Major Depressive Disorder, Recurrent, Severe, without psychotic features; Anxiety Disorder; Substance use Disorder   Patient Reported Information How did you hear about [place]? Self   Referral name: No data recorded  Referral phone number: No data recorded Whom do you see for routine medical  problems? -- (unk)   Practice/Facility Name: No data recorded  Practice/Facility Phone Number: No data recorded  Name of Contact: No data recorded  Contact Number: No data recorded  Contact Fax Number: No data recorded  Prescriber Name: No data recorded  Prescriber Address (if known): No data recorded What Is the Reason for Your Visit/Call Today? Suicidal Ideations with plan commit suicide by Carbon Monoxide Poisoning, Depression, Self Harm, "I feel like shit"  How Long Has This Been Causing You Problems? > than 6 months  Have You Recently Been in Any Inpatient Treatment (Hospital/Detox/Crisis Center/28-Day Program)? No   Name/Location of Program/Hospital:No data recorded  How Long Were You There? No data recorded  When Were You Discharged? No data recorded Have You Ever Received Services From [place] Before? Yes   Who Do You See at [facility]? 08/14/2019 Inpatient [facility]iatric Services at [facility]  Have You Recently Had Any Thoughts About Hurting Yourself? Yes   Are You Planning to Commit Suicide/Harm  Yourself At This time?  Yes  Have you Recently Had Thoughts About Hurting Someone Edwin Burton? No   Explanation: No data recorded Have You Used Any Alcohol or Drugs in the Past 24 Hours? Yes   How Long Ago Did You Use Drugs or Alcohol?  0000 (on-going use of substances)   What Did You Use and How Much? "Whippets", Acid, Mushrooms, Nitrite "Poppers", THC  What Do You Feel Would Help You the Most Today? Medication; Therapy; Other (Comment) (Inpatient psychiatric treatment)  Do You Currently Have a Therapist/Psychiatrist? No   Name of Therapist/Psychiatrist: No data recorded  Have You Been Recently Discharged From Any Office Practice or Programs? No   Explanation of Discharge From Practice/Program:  No data recorded    CCA Screening Triage Referral Assessment Type of Contact: Face-to-Face   Is this Initial or Reassessment? Initial Assessment   Date Telepsych consult ordered in [facility]:  07/29/2020   Time Telepsych consult ordered in [facility]:  No data recorded Patient Reported Information Reviewed? Yes   Patient Left Without Being Seen? No data recorded  Reason for Not Completing Assessment: No data recorded Collateral Involvement: no collateral  Does Patient Have a Court Appointed Legal Guardian? No data recorded  Name and Contact of Legal Guardian:  No data recorded If Minor and Not Living with Parent(s), Who has Custody? No data recorded Is CPS involved or ever been involved? Never  Is APS involved or ever been involved? Never  Patient Determined To Be At Risk for Harm To Self or Others Based on Review of Patient Reported Information or Presenting Complaint? Yes, for Self-Harm   Method: No data recorded  Availability of Means: No data recorded  Intent: No data recorded  Notification Required: No data recorded  Additional Information for Danger to Others Potential:  No data recorded  Additional Comments for Danger to Others Potential:  No data recorded  Are There Guns or Other  Weapons in Your Home?  No data recorded   Types of Guns/Weapons: No data recorded   Are These Weapons Safely Secured?                              No data recorded   Who Could Verify You Are Able To Have These Secured:    No data recorded Do You Have any Outstanding Charges, Pending Court Dates, Parole/Probation? No data recorded Contacted To Inform of Risk of Harm To Self or Others:  No data recorded Location of Assessment: -- [facility] walk in)  Does Patient Present under Involuntary Commitment? No   IVC Papers Initial File Date: No data recorded  [place] of Residence: [place]  Patient Currently Receiving the Following Services: Individual Therapy; Medication Management   Determination of Need: Emergent (2 hours)   Options For Referral: Inpatient Hospitalization; Other: Comment ([facility]-observation unit)   Melynda Ripple, Counselor

## 2020-07-29 NOTE — ED Notes (Signed)
Dressing applied to left thigh cut marks per pt request (telfa, gauze, clear tape).

## 2020-07-30 ENCOUNTER — Encounter (HOSPITAL_COMMUNITY): Payer: Self-pay | Admitting: Psychiatry

## 2020-07-30 ENCOUNTER — Inpatient Hospital Stay (HOSPITAL_COMMUNITY)
Admission: AD | Admit: 2020-07-30 | Discharge: 2020-08-01 | DRG: 885 | Disposition: A | Discharge status: Home / Self Care | Source: Intra-hospital | Attending: Psychiatry | Admitting: Psychiatry

## 2020-07-30 DIAGNOSIS — F319 Bipolar disorder, unspecified: Secondary | ICD-10-CM | POA: Diagnosis present

## 2020-07-30 DIAGNOSIS — F1810 Inhalant abuse, uncomplicated: Secondary | ICD-10-CM | POA: Diagnosis present

## 2020-07-30 DIAGNOSIS — G479 Sleep disorder, unspecified: Secondary | ICD-10-CM | POA: Diagnosis present

## 2020-07-30 DIAGNOSIS — F1210 Cannabis abuse, uncomplicated: Secondary | ICD-10-CM

## 2020-07-30 DIAGNOSIS — R45851 Suicidal ideations: Secondary | ICD-10-CM | POA: Diagnosis present

## 2020-07-30 DIAGNOSIS — F411 Generalized anxiety disorder: Secondary | ICD-10-CM | POA: Diagnosis present

## 2020-07-30 DIAGNOSIS — Z23 Encounter for immunization: Secondary | ICD-10-CM

## 2020-07-30 DIAGNOSIS — Z9151 Personal history of suicidal behavior: Secondary | ICD-10-CM

## 2020-07-30 DIAGNOSIS — F322 Major depressive disorder, single episode, severe without psychotic features: Secondary | ICD-10-CM

## 2020-07-30 DIAGNOSIS — F332 Major depressive disorder, recurrent severe without psychotic features: Secondary | ICD-10-CM | POA: Diagnosis not present

## 2020-07-30 DIAGNOSIS — F3130 Bipolar disorder, current episode depressed, mild or moderate severity, unspecified: Secondary | ICD-10-CM

## 2020-07-30 DIAGNOSIS — Z20822 Contact with and (suspected) exposure to covid-19: Secondary | ICD-10-CM | POA: Diagnosis present

## 2020-07-30 MED ORDER — INFLUENZA VAC SPLIT QUAD 0.5 ML IM SUSY
0.5000 mL | PREFILLED_SYRINGE | INTRAMUSCULAR | Status: AC
  Administered 2020-07-31: 0.5 mL via INTRAMUSCULAR
  Filled 2020-07-30: qty 0.5

## 2020-07-30 MED ORDER — FLUOXETINE HCL 20 MG PO CAPS
20.0000 mg | ORAL_CAPSULE | Freq: Every day | ORAL | Status: DC
  Administered 2020-07-31: 20 mg via ORAL
  Filled 2020-07-30 (×2): qty 1

## 2020-07-30 MED ORDER — ACETAMINOPHEN 325 MG PO TABS: 650.0000 mg | ORAL_TABLET | Freq: Four times a day (QID) | ORAL | Status: DC | PRN

## 2020-07-30 MED ORDER — FLUOXETINE HCL 20 MG PO CAPS: 20.0000 mg | ORAL_CAPSULE | Freq: Every day | ORAL | 1 refills | Status: DC

## 2020-07-30 MED ORDER — TRAZODONE HCL 50 MG PO TABS
50.0000 mg | ORAL_TABLET | Freq: Every evening | ORAL | Status: DC | PRN
  Administered 2020-07-30: 50 mg via ORAL
  Filled 2020-07-30: qty 1

## 2020-07-30 MED ORDER — LURASIDONE HCL 40 MG PO TABS
40.0000 mg | ORAL_TABLET | Freq: Every day | ORAL | Status: DC
  Administered 2020-07-31 – 2020-08-01 (×2): 40 mg via ORAL
  Filled 2020-07-30 (×3): qty 1

## 2020-07-30 MED ORDER — SPIRONOLACTONE 50 MG PO TABS
50.0000 mg | ORAL_TABLET | Freq: Two times a day (BID) | ORAL | Status: DC
  Administered 2020-07-30 – 2020-08-01 (×4): 50 mg via ORAL
  Filled 2020-07-30 (×6): qty 1

## 2020-07-30 MED ORDER — HYDROXYZINE HCL 50 MG PO TABS: 50.0000 mg | ORAL_TABLET | Freq: Every day | ORAL | Status: DC | PRN

## 2020-07-30 MED ORDER — MAGNESIUM HYDROXIDE 400 MG/5ML PO SUSP: 30.0000 mL | Freq: Every day | ORAL | Status: DC | PRN

## 2020-07-30 MED ORDER — PNEUMOCOCCAL VAC POLYVALENT 25 MCG/0.5ML IJ INJ
0.5000 mL | INJECTION | INTRAMUSCULAR | Status: AC
  Administered 2020-07-31: 0.5 mL via INTRAMUSCULAR
  Filled 2020-07-30: qty 0.5

## 2020-07-30 MED ORDER — ALUM & MAG HYDROXIDE-SIMETH 200-200-20 MG/5ML PO SUSP: 30.0000 mL | ORAL | Status: DC | PRN

## 2020-07-30 MED ORDER — SPIRONOLACTONE 50 MG PO TABS: 50.0000 mg | ORAL_TABLET | Freq: Two times a day (BID) | ORAL | Status: AC

## 2020-07-30 MED ORDER — LURASIDONE HCL 40 MG PO TABS: 40.0000 mg | ORAL_TABLET | Freq: Every day | ORAL | Status: DC

## 2020-07-30 MED ORDER — TRAZODONE HCL 50 MG PO TABS: 50.0000 mg | ORAL_TABLET | Freq: Every evening | ORAL | Status: DC | PRN

## 2020-07-30 NOTE — ED Notes (Signed)
Pt sleeping@this time. Breathing even and unlabored. Will continue to monitor for safety 

## 2020-07-30 NOTE — ED Notes (Addendum)
Pt continue to endorse SI with a plan to die by carbon monoxide poisoning or shoot self. Pt requesting inpatient stay. Pt states feeling unsafe to return to home and unable to contract for safety. NP aware. Pt received am medications without difficulty. Left thigh lacerations (self-inflicted) cleaned and neosporin applied without difficulty. No s/s of infection noted. Provided education on infection prevention and when to report sx to staff. Pt verbalized understanding. Will continue to monitor.

## 2020-07-30 NOTE — ED Notes (Signed)
Safe transport called. Pt made aware that transportation was in route to carry pt to [facility] inpatient stay. Voluntary consent form signed by pt. Safety maintained.

## 2020-07-30 NOTE — ED Notes (Signed)
Pt sleeping in no acute distress. RR even and unlabored. Safety maintained. 

## 2020-07-30 NOTE — Progress Notes (Signed)
Pt accepted to [facility] room 402-1  Meets inpatient criteria per Reola Calkins, FNP  Dr. Jola Babinski is the attending provider.    Call report to [phone]    Sharion Dove, RN @ [facility] notified in person.     Pt is Voluntary.    Pt may be transported by safe transport  Pt scheduled  to arrive at [facility] at 1200 PM  Signed:  Corky Crafts, MSW, [place], LCASA 07/30/2020 11:21 AM

## 2020-07-30 NOTE — [facility] Group Notes (Signed)
[facility] LCSW Group Therapy  07/30/2020 2:13 PM  Type of Therapy:  Reflections and Self-Care   Participation Level:  Active  Participation Quality:  Appropriate  Affect:  Appropriate  Cognitive:  Appropriate  Insight:  Developing/Improving  Engagement in Therapy:  Developing/Improving  Modes of Intervention:  Activity  Summary of Progress/Problems:  Patient was given time to reflect and talk with their peers during a recreation period.   Met[place] Kung Webster 07/30/2020, 2:13 PM

## 2020-07-30 NOTE — Discharge Instructions (Addendum)
Discharge to [facility] 

## 2020-07-30 NOTE — ED Provider Notes (Signed)
FBC/OBS ASAP Discharge Summary  Date and Time: 07/30/2020 10:45 AM  Name: Edwin Burton  MRN:  [id number]   Discharge Diagnoses:  Final diagnoses:  Anxiety state  Suicidal ideation  MDD (major depressive disorder), severe (HCC)    Subjective: Patient continues to report feeling depressed with suicidal ideations.  She reports that she has chronic suicidal ideations but they have become worse over the last couple weeks where she is having thoughts with a plan.  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  She reports that she was admitted to [facility] in March 2021 but feels that maybe her medications need to be changed.  Discussed possibly taking some leave from work or from school and to establish a safety plan with her parents.  However she reports that she cannot contract for safety as she does not want to include her parents and a safety plan at this time because they are housesitting for someone else and she cannot go there to stay.  Patient is requesting to be admitted for hospital stay.  Stay Summary: Patient is a 20 year old single male transgender that presented to the [facility] after being assessed at [facility] due to depression and suicidal ideations.  Patient reported that suicidal ideations and depression have worsened over the last couple weeks with having plan to kill herself with calm monoxide fumes.  She reports that she sees her psychiatrist and therapist regularly.  She also reports 2 previous hospitalizations 1 in March 2021 and 1 at home and she is in the ninth grade.  Patient reported that she is compliant with her medications of Prozac, Latuda, Klonopin as needed, and hydroxyzine.  Patient was admitted to continuous observation and further assessment.  Today the patient has continued to endorse suicidal ideations and unable to contract for safety and unable to establish a safety plan.  Patient will  be admitted to the behavioral health hospital for inpatient psychiatric treatment  Total Time spent with patient: 20 minutes  Past Psychiatric History: MDD, previous hospiatlization Past Medical History: History reviewed. No pertinent past medical history. History reviewed. No pertinent surgical history. Family History: History reviewed. No pertinent family history. Family Psychiatric History: None reported Social History:  Social History   Substance and Sexual Activity  Alcohol Use None     Social History   Substance and Sexual Activity  Drug Use Not on file    Social History   Socioeconomic History  . Marital status: Single    Spouse name: Not on file  . Number of children: Not on file  . Years of education: Not on file  . Highest education level: Not on file  Occupational History  . Not on file  Tobacco Use  . Smoking status: Never Smoker  . Smokeless tobacco: Never Used  Substance and Sexual Activity  . Alcohol use: Not on file  . Drug use: Not on file  . Sexual activity: Not on file  Other Topics Concern  . Not on file  Social History Narrative  . Not on file   Social Determinants of Health   Financial Resource Strain: Not on file  Food Insecurity: Not on file  Transportation Needs: Not on file  Physical Activity: Not on file  Stress: Not on file  Social Connections: Not on file   SDOH:  SDOH Screenings   Alcohol Screen: Low Risk   . Last Alcohol Screening  Score (AUDIT): 6  Depression (PHQ2-9): Not on file  Financial Resource Strain: Not on file  Food Insecurity: Not on file  Housing: Not on file  Physical Activity: Not on file  Social Connections: Not on file  Stress: Not on file  Tobacco Use: Low Risk   . Smoking Tobacco Use: Never Smoker  . Smokeless Tobacco Use: Never Used  Transportation Needs: Not on file    Has this patient used any form of tobacco in the last 30 days? (Cigarettes, Smokeless Tobacco, Cigars, and/or Pipes) A prescription  for an FDA-approved tobacco cessation medication was offered at discharge and the patient refused  Current Medications:  Current Facility-Administered Medications  Medication Dose Route Frequency Provider Last Rate Last Admin  . acetaminophen (TYLENOL) tablet 650 mg  650 mg Oral Q6H PRN Rankin, Shuvon B, NP      . alum & mag hydroxide-simeth (MAALOX/MYLANTA) 200-200-20 MG/5ML suspension 30 mL  30 mL Oral Q4H PRN Rankin, Shuvon B, NP      . FLUoxetine (PROZAC) capsule 20 mg  20 mg Oral Daily Nira Conn A, NP   20 mg at 07/30/20 0942  . hydrOXYzine (ATARAX/VISTARIL) tablet 50 mg  50 mg Oral Daily PRN Nira Conn A, NP   50 mg at 07/29/20 2146  . lurasidone (LATUDA) tablet 40 mg  40 mg Oral Daily Nira Conn A, NP   40 mg at 07/30/20 0942  . magnesium hydroxide (MILK OF MAGNESIA) suspension 30 mL  30 mL Oral Daily PRN Rankin, Shuvon B, NP      . neomycin-bacitracin-polymyxin (NEOSPORIN) ointment packet 1 application  1 application Topical Daily Nira Conn A, NP   1 application at 07/30/20 0944  . spironolactone (ALDACTONE) tablet 50 mg  50 mg Oral BID Nira Conn A, NP   50 mg at 07/30/20 0942  . traZODone (DESYREL) tablet 50 mg  50 mg Oral QHS PRN Jackelyn Poling, NP   50 mg at 07/29/20 2146   Current Outpatient Medications  Medication Sig Dispense Refill  . estradiol (VIVELLE-DOT) 0.05 MG/24HR patch Place 1 patch onto the skin 2 (two) times a week. Uses alongside the 0.1mg  patch for a total dose of 0.15mg . Uses on Sundays and Wednesdays.    Marland Kitchen estradiol (VIVELLE-DOT) 0.1 MG/24HR patch Place 1 patch onto the skin 2 (two) times a week. Uses alongside the 0.05mg  patch for a total dose of 0.15mg . Uses on Sundays and Wednesdays.    Marland Kitchen FLUoxetine (PROZAC) 20 MG capsule Take 1 capsule (20 mg total) by mouth daily. 90 capsule 1  . hydrOXYzine (ATARAX/VISTARIL) 50 MG tablet Take 1 tablet (50 mg total) by mouth daily as needed for anxiety. 30 tablet   . [START ON 07/31/2020] lurasidone (LATUDA) 40 MG  TABS tablet Take 1 tablet (40 mg total) by mouth daily. 30 tablet   . spironolactone (ALDACTONE) 50 MG tablet Take 1 tablet (50 mg total) by mouth 2 (two) times daily.    . traZODone (DESYREL) 50 MG tablet Take 1 tablet (50 mg total) by mouth at bedtime as needed for sleep.      PTA Medications: (Not in a hospital admission)   Musculoskeletal  Strength & Muscle Tone: within normal limits Gait & Station: normal Patient leans: N/A  Psychiatric Specialty Exam  Presentation  General Appearance: Appropriate for Environment; Casual  Eye Contact:Fair  Speech:Clear and Coherent; Normal Rate  Speech Volume:Decreased  Handedness:Right   Mood and Affect  Mood:Depressed  Affect:Appropriate; Congruent; Depressed   Thought Process  Thought Processes:Coherent  Descriptions of Associations:Intact  Orientation:Full (Time, Place and Person)  Thought Content:WDL  Hallucinations:Hallucinations: None  Ideas of Reference:None  Suicidal Thoughts:Suicidal Thoughts: Yes, Active SI Active Intent and/or Plan: With Intent; With Plan  Homicidal Thoughts:Homicidal Thoughts: No   Sensorium  Memory:Immediate Good; Recent Good; Remote Good  Judgment:Intact  Insight:Good   Executive Functions  Concentration:Good  Attention Span:Good  Recall:Good  Fund of Knowledge:Good  Language:Good   Psychomotor Activity  Psychomotor Activity:Psychomotor Activity: Normal   Assets  Assets:Communication Skills; Desire for Improvement; Financial Resources/Insurance; Housing; Physical Health; Social Support; Transportation   Sleep  Sleep:Sleep: Good   Physical Exam  Physical Exam Vitals and nursing note reviewed.  Constitutional:      Appearance: She is well-developed.  HENT:     Head: Normocephalic.  Eyes:     Pupils: Pupils are equal, round, and reactive to light.  Cardiovascular:     Rate and Rhythm: Normal rate.  Pulmonary:     Effort: Pulmonary effort is normal.   Musculoskeletal:        General: Normal range of motion.  Neurological:     Mental Status: She is alert and oriented to person, place, and time.    Review of Systems  Constitutional: Negative.   HENT: Negative.   Eyes: Negative.   Respiratory: Negative.   Cardiovascular: Negative.   Gastrointestinal: Negative.   Genitourinary: Negative.   Musculoskeletal: Negative.   Skin: Negative.   Neurological: Negative.   Endo/Heme/Allergies: Negative.   Psychiatric/Behavioral: Positive for depression and suicidal ideas.   Blood pressure 134/82, pulse 84, temperature 98.4 F (36.9 C), temperature source Oral, SpO2 99 %. There is no height or weight on file to calculate BMI.   Disposition: Discharge to [facility] Wilburt Messina, FNP 07/30/2020, 10:45 AM

## 2020-07-30 NOTE — Progress Notes (Signed)
Patient rated her day as a 3 or 4 out of 10. She admits to feeling like "shit" since she is bored and has slept a great deal today. Her goal for tomorrow is to be "patient with myself" and to be "kind" to herself.

## 2020-07-30 NOTE — Progress Notes (Signed)
Pt is a 20 year old transgender male who refers to herself as "Edwin Burton".  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  Pt endorsed SI, stating that her plan was to kill herself by setting up a contraption where she inhaled carbon monoxide to poison herself "so I can have a peaceful death".   Pt listed some reasons why she would not want to kill herself 1) "My cat [name] (pronounced Medley) means everything to me" 2) "I wouldn't want to  Put my friend's and loved ones in any pain they would feel if I killed myself."  Pt reported that she's had thoughts of killing herself since she was in the 9th grade which was 6 years ago.    Pt disclosed that she has an  endocrinologist whose office is in the [employer].  Pt could not remember endocrinologist name.  Pt also has a Chartered certified accountant.  [name] is aware pt is at [facility].    Pt's pscyhiatrist is [name].  Pt signed consent forms and RN answered pt's questions.  Pt is on the unit and does not appear to be in any distress at this time.

## 2020-07-30 NOTE — ED Notes (Signed)
Pt discharged to [facility] via safe transport. Continued to endorse SI upon discharge. RN stated for the pt to take care of himself and good luck in his care. Pt stated, "I can't make no promises". RN encouraged pt to stay positive as he was the one requesting help. Pt agreed. All belongings given to safe transport driver intact. Safety maintained.

## 2020-07-31 DIAGNOSIS — F1810 Inhalant abuse, uncomplicated: Secondary | ICD-10-CM

## 2020-07-31 DIAGNOSIS — F1210 Cannabis abuse, uncomplicated: Secondary | ICD-10-CM

## 2020-07-31 DIAGNOSIS — F332 Major depressive disorder, recurrent severe without psychotic features: Secondary | ICD-10-CM

## 2020-07-31 DIAGNOSIS — F319 Bipolar disorder, unspecified: Secondary | ICD-10-CM

## 2020-07-31 LAB — HEMOGLOBIN A1C
Hgb A1c MFr Bld: 4.9 % (ref 4.8–5.6)
Mean Plasma Glucose: 94 mg/dL

## 2020-07-31 MED ORDER — TRAZODONE HCL 50 MG PO TABS
50.0000 mg | ORAL_TABLET | Freq: Every evening | ORAL | Status: DC | PRN
  Administered 2020-07-31 (×2): 50 mg via ORAL
  Filled 2020-07-31 (×4): qty 1

## 2020-07-31 MED ORDER — TETANUS-DIPHTHERIA TOXOIDS TD 5-2 LFU IM INJ
0.5000 mL | INJECTION | Freq: Once | INTRAMUSCULAR | Status: AC
  Administered 2020-08-01: 0.5 mL via INTRAMUSCULAR
  Filled 2020-07-31 (×2): qty 0.5

## 2020-07-31 MED ORDER — FLUOXETINE HCL 20 MG PO CAPS
40.0000 mg | ORAL_CAPSULE | Freq: Every day | ORAL | Status: DC
  Administered 2020-08-01: 40 mg via ORAL
  Filled 2020-07-31 (×2): qty 2

## 2020-07-31 MED ORDER — BACITRACIN-NEOMYCIN-POLYMYXIN OINTMENT TUBE
TOPICAL_OINTMENT | CUTANEOUS | Status: DC | PRN
  Filled 2020-07-31: qty 28.34

## 2020-07-31 MED ORDER — FLUOXETINE HCL 20 MG PO CAPS
20.0000 mg | ORAL_CAPSULE | Freq: Once | ORAL | Status: AC
  Administered 2020-07-31: 20 mg via ORAL
  Filled 2020-07-31: qty 1

## 2020-07-31 NOTE — BHH Counselor (Signed)
Adult Comprehensive Assessment  Patient ID: Jacques Fife, adult   DOB: Oct 28, 2000, 20 y.o.   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 / Job issues: Pt reports working at [employer] Relationships: Pt reports no stressors Surveyor, quantity / Lack of resources (include bankruptcy): Pt reports no stressors Housing / Lack of housing: Pt reports living alone in an apartment Physical health (include injuries & life threatening diseases): Pt reports no stressors Social relationships: Pt reports no stressors Substance abuse: Pt reports smoking Marijuana daily Bereavement / Loss: Pt reports no stressors  Living/Environment/Situation:  Living Arrangements: Alone (Pt reports having a cat at home) Living conditions (as described by patient or guardian): "It's nice there but sometimes I wish I had a roommate" Who else lives in the home?: No One How long has patient lived in current situation?: 9 months What is atmosphere in current home: Comfortable  Family History:  Marital status: Single Are you sexually active?: No Has your sexual activity been affected by drugs, alcohol, medication, or emotional stress?: No Does patient have children?: No  Childhood History:  By whom was/is the patient raised?: Mother,Father Description of patient's relationship with caregiver when they were a child: "It was good" Patient's description of current relationship with people who raised him/her: "We still get along very well" How were you disciplined when you got in trouble as a child/adolescent?:  Groundings Does patient have siblings?: Yes Number of Siblings: 2 Description of patient's current relationship with siblings: "I have an older brother and an older sister" Did patient suffer any verbal/emotional/physical/sexual abuse as a child?: No Did patient suffer from severe childhood neglect?: No Has patient ever been sexually abused/assaulted/raped as an adolescent or adult?: No Was the patient ever a victim of a crime or a disaster?: No Witnessed domestic violence?: No Has patient been affected by domestic violence as an adult?: No  Education:  Highest grade of school patient has completed: 12th grade Currently a student?: Yes Name of school: [employer] How long has the patient attended?: 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, clinical (histocompatibility and immunogenetics) major Learning disability?: No  Employment/Work Situation:   Employment situation: Employed 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 How long has patient been employed?: 10 months Patient's job has been impacted by current illness: No What is the longest time patient has a held a job?: 2 years 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 [employer] Has patient ever been in the [employer]?: No  Financial Resources:   Financial resources: Income from [employer] Does patient have a Lawyer or guardian?: No  Alcohol/Substance Abuse:   What has been your use of drugs/alcohol within the last 12 months?: Pt reports smoking Marijuana daily If attempted suicide, did drugs/alcohol play a role in this?: No Alcohol/Substance Abuse Treatment Hx: Denies past history Has alcohol/substance abuse ever caused legal problems?: No  Social Support System:   Patient's Community Support System: Production assistant, radio System: Paramedic, parents, psychiatrist, siblings Type of faith/religion: None How does patient's faith help to cope with current illness?: None  Leisure/Recreation:   Do You  Have Hobbies?:  Yes Leisure and Hobbies: Playing music, hanging out with my cat and my friends, and watching cartoons  Strengths/Needs:   What is the patient's perception of their strengths?: Good listener, helping others, physcially strong, sewing, and being good with my hands Patient states they can use these personal strengths during their treatment to contribute to their recovery: "It gives me something to do" Patient states these barriers may affect/interfere with their treatment: None Patient states these barriers may affect their return to the community: None Other important information patient would like considered in planning for their treatment: None  Discharge Plan:   Currently receiving community mental health services: Yes (From Whom) ([facility] in [place] and [facility] in [place].) Patient states concerns and preferences for aftercare planning are: "I want to keep my same therapist and psychiatrist" Patient states they will know when they are safe and ready for discharge when: "I am ready to go now" Does patient have access to transportation?: Yes (Pt reports car is here at [facility]) Does patient have financial barriers related to discharge medications?: No Will patient be returning to same living situation after discharge?: Yes  Summary/Recommendations:   Summary and Recommendations (to be completed by the evaluator): Javaughn Opdahl is a 20 year old, Caucasian, transgender male who is transitioning to male.  The Pt was admitted to the hospital due to SI, worsening depression, and self-harming behaviors.  The Pt reports current and previous cutting behaviors as well as depression that has been present since they were in high school.  The Pt reports living alone in an apartment with their cat and attending [employer] as a Sophomore in [employer] major. The Pt reports no stressors with family or  social relationships.  The Pt reports smoking Marijuana daily but denies all other substance use.  The Pt reports being employed and having their own transportation.  While in the hospital the Pt can benefit from crisis stabilization, medication evaluation, group therapy, psycho-education, case management, and discharge planning.  Upon discharge the Pt will return to their apartment and will follow up at the [facility] and with [facility] for therapy and medications.  The Pt reports already being established with these services and sees their therapist twice a week.  Aram Beecham. 07/31/2020

## 2020-07-31 NOTE — BHH Suicide Risk Assessment (Signed)
BHH INPATIENT:  Family/Significant Other Suicide Prevention Education  Suicide Prevention Education:  Patient Refusal for Family/Significant Other Suicide Prevention Education: The patient Edwin Burton has refused to provide written consent for family/significant other to be provided Family/Significant Other Suicide Prevention Education during admission and/or prior to discharge.  Physician notified.  CSW completed SPE with the patient.  A pamphlet about suicide prevention will be placed in the patient's chart prior to discharge for the patient to review.    Metro Kung Armani Brar 07/31/2020, 10:47 AM

## 2020-07-31 NOTE — H&P (Signed)
Psychiatric Admission Assessment Adult  Patient Identification: Edwin Burton MRN:  [id number] Date of Evaluation:  07/31/2020 Chief Complaint:  MDD (major depressive disorder), recurrent episode, severe ([place]) [F33.2] Principal Diagnosis: Bipolar disorder, unspecified ([place]) Diagnosis:  Principal Problem:   Bipolar disorder, unspecified ([place]) Active Problems:   Marijuana abuse   Inhalant abuse ([place])  ID: In brief: Edwin Burton is a 20 year old transgender male to male who presented to [facility] as a walk-in on 2/15 for evaluation of depression and  suicidal ideation with a plan. She was seen  and admitted to [facility] for overnight observation. Upon reassessment 2/16 she was unable to contract for safety and was admitted to [facility] for acute stabilization and medication management. Her last [facility] admission was March 2021. 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 She sees Dr Tracie Harrier psychiatrist at [facility], and sees her therapist at [facility] twice weekly. Her home  medications have been restarted.   History of Present Illness: Edwin Burton is a 20 year old transgender male to male who presented to [facility] as a walk-in on 2/15 for evaluation of depression and  suicidal ideation with a plan. She has a psychiatric history of MDD, GAD and Suicidal Ideation. She was seen  and admitted to [facility] for overnight observation. Upon reassessment 2/16 she was unable to contract for safety and was admitted to [facility] for acute stabilization and medication management. Her last [facility] admission was March 2021. 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 She sees Dr Tracie Harrier psychiatrist at [facility], and sees her therapist at [facility] twice weekly. Her home  medications have been restarted.   Evaluation on the unit: Patient was seen face to face, chart reviewed and case discussed with treatment team.  Patient stated 2 weeks ago she felt manic and was engaging in risky behaviors such as; shoplifting, stealing a  shopping cart, egging a building and having increased sexual encounters. She stated she "crashed hard" and then became very depressed. She eventually started having increased suicidal thoughts with a plan to end her life with carbon monoxide poisoning. She did not have access to the items she needed to complete the plan and opted to present to [facility] for evaluation. She stated she has attempted suicide in the past, last time was 1 year ago. She did not specify what she did but she did state she has attempted to overdose or hang herself in the past. She has a history of cutting and has several superficial cuts to her left thigh. She stated cutting brings her clarity. She has old scars to her forearms and her right thigh.   She has a history of inhalant use with sex and smokes marijuana daily. UDS positive for THC. She denies alcohol use, Alcohol level is negative. She is currently undergoing hormone therapy and plans to have gender reassignment surgery at some point. Discussed with patient the decreased efficacy of her medications with illicit drug use. Discussed the probability of Bipolar Disorder with patient. Explained that with active drug use it is difficult to diagnose. Encouraged patient to stay sober so an accurate diagnosis can be made, which would result in accurate medication changes or titrations to current medications. Patient verbalized understanding.    Patient's protective factors include supportive parents and siblings, her cat, friends and college. She works at [employer] as a Scientist, water quality. Her identified stressors are school, work, and relationships. She is a Scientist, research (life sciences) major and stated school and grades are good. She  enjoys the creativity the theater brings. Risk factors identified are anxiety, depression, gender identity, suicidality, relationships, risky behaviors and illicit drug use.   She is calm and cooperative, and pleasant on approach. She denies suicidal ideation, plan or intent today. She  denies homicidal ideation, auditory and visual hallucinations and paranoia. She does not appear to have delusional thinking or be responding to internal stimuli. She stated she wants to go home, she misses her cat. She sees her therapist twice weekly and sees her psychiatrist, Dr Tracie Harrier at [facility], last saw Dr Tracie Harrier on Tuesday. She was instructed to go to the hospital for her safety. She feels safe on the unit and has made no attempts at self-harm. She feels safe in her home and denies access to weapons. Discussed medication titrations with patient, she opted to wait and speak with her psychiatrist.  Encouraged patient to attended group therapy and participate in the therapeutic milieu. Continued encouragement and support offered.   Associated Signs/Symptoms: Depression Symptoms:  depressed mood, feelings of worthlessness/guilt, hopelessness, suicidal thoughts without plan, disturbed sleep, Duration of Depression Symptoms: Less than two weeks  (Hypo) Manic Symptoms:  Impulsivity, Labiality of Mood, Sexually Inapproprite Behavior, Anxiety Symptoms:  Excessive Worry, Psychotic Symptoms:  Hallucinations: None, patient denies Duration of Psychotic Symptoms: No data recorded PTSD Symptoms: Negative Total Time spent with patient: 45 minutes  Past Psychiatric History: MDD, GAD  Is the patient at risk to self? Yes.    Has the patient been a risk to self in the past 6 months? No.  Has the patient been a risk to self within the distant past? Yes.    Is the patient a risk to others? No.  Has the patient been a risk to others in the past 6 months? No.  Has the patient been a risk to others within the distant past? No.   Prior Inpatient Therapy:  Yes Prior Outpatient Therapy:  Yes  Alcohol Screening: 1. How often do you have a drink containing alcohol?: Never 2. How many drinks containing alcohol do you have on a typical day when you are drinking?: 1 or 2 3. How often do you  have six or more drinks on one occasion?: Never AUDIT-C Score: 0 4. How often during the last year have you found that you were not able to stop drinking once you had started?: Never 5. How often during the last year have you failed to do what was normally expected from you because of drinking?: Never 6. How often during the last year have you needed a first drink in the morning to get yourself going after a heavy drinking session?: Never 7. How often during the last year have you had a feeling of guilt of remorse after drinking?: Never 8. How often during the last year have you been unable to remember what happened the night before because you had been drinking?: Never 9. Have you or someone else been injured as a result of your drinking?: No 10. Has a relative or friend or a doctor or another health worker been concerned about your drinking or suggested you cut down?: No Alcohol Use Disorder Identification Test Final Score (AUDIT): 0 Substance Abuse History in the last 12 months:  Yes.   Consequences of Substance Abuse: Risky behaviors: increased sexual behaviors, shoplifting, minor crimes Previous Psychotropic Medications: Yes  Psychological Evaluations: Yes  Past Medical History: History reviewed. No pertinent past medical history. History reviewed. No pertinent surgical history. Family History: History reviewed. No  pertinent family history. Family Psychiatric  History: Mother-Anxiety Tobacco Screening: Have you used any form of tobacco in the last 30 days? (Cigarettes, Smokeless Tobacco, Cigars, and/or Pipes): Yes Tobacco use, Select all that apply: smokeless tobacco use daily Are you interested in Tobacco Cessation Medications?: No, patient refused Counseled patient on smoking cessation including recognizing danger situations, developing coping skills and basic information about quitting provided: Yes Social History:  Social History   Substance and Sexual Activity  Alcohol Use None      Social History   Substance and Sexual Activity  Drug Use Not on file    Additional Social History: Marital status: Single Are you sexually active?: No Has your sexual activity been affected by drugs, alcohol, medication, or emotional stress?: No Does patient have children?: No   Allergies:  No Known Allergies Lab Results:  Results for orders placed or performed during the hospital encounter of 07/29/20 (from the past 48 hour(s))  Resp Panel by RT-PCR (Flu A&B, Covid) Nasopharyngeal Swab     Status: None   Collection Time: 07/29/20  5:05 PM   Specimen: Nasopharyngeal Swab; Nasopharyngeal(NP) swabs in vial transport medium  Result Value Ref Range   SARS Coronavirus 2 by RT PCR NEGATIVE NEGATIVE    Comment: (NOTE) SARS-CoV-2 target nucleic acids are NOT DETECTED.  The SARS-CoV-2 RNA is generally detectable in upper respiratory specimens during the acute phase of infection. The lowest concentration of SARS-CoV-2 viral copies this assay can detect is 138 copies/mL. A negative result does not preclude SARS-Cov-2 infection and should not be used as the sole basis for treatment or other patient management decisions. A negative result may occur with  improper specimen collection/handling, submission of specimen other than nasopharyngeal swab, presence of viral mutation(s) within the areas targeted by this assay, and inadequate number of viral copies(<138 copies/mL). A negative result must be combined with clinical observations, patient history, and epidemiological information. The expected result is Negative.  Fact Sheet for Patients:  EntrepreneurPulse.com.au  Fact Sheet for Healthcare Providers:  IncredibleEmployment.be  This test is no t yet approved or cleared by the [place] FDA and  has been authorized for detection and/or diagnosis of SARS-CoV-2 by FDA under an Emergency Use Authorization (EUA). This EUA will remain  in effect  (meaning this test can be used) for the duration of the COVID-19 declaration under Section 564(b)(1) of the Act, 21 U.S.C.section 360bbb-3(b)(1), unless the authorization is terminated  or revoked sooner.       Influenza A by PCR NEGATIVE NEGATIVE   Influenza B by PCR NEGATIVE NEGATIVE    Comment: (NOTE) The Xpert Xpress SARS-CoV-2/FLU/RSV plus assay is intended as an aid in the diagnosis of influenza from Nasopharyngeal swab specimens and should not be used as a sole basis for treatment. Nasal washings and aspirates are unacceptable for Xpert Xpress SARS-CoV-2/FLU/RSV testing.  Fact Sheet for Patients: EntrepreneurPulse.com.au  Fact Sheet for Healthcare Providers: IncredibleEmployment.be  This test is not yet approved or cleared by the [place] FDA and has been authorized for detection and/or diagnosis of SARS-CoV-2 by FDA under an Emergency Use Authorization (EUA). This EUA will remain in effect (meaning this test can be used) for the duration of the COVID-19 declaration under Section 564(b)(1) of the Act, 21 U.S.C. section 360bbb-3(b)(1), unless the authorization is terminated or revoked.  Performed at [facility], [place] [address], [place], [place] [address]   POC SARS Coronavirus 2 Ag-ED - Nasal Swab     Status: Normal  Collection Time: 07/29/20  5:05 PM  Result Value Ref Range   SARS Coronavirus 2 Ag Negative Negative  CBC with Differential/Platelet     Status: None   Collection Time: 07/29/20  5:15 PM  Result Value Ref Range   WBC 7.4 4.0 - 10.5 K/uL   RBC 4.76 4.22 - 5.81 MIL/uL   Hemoglobin 14.0 13.0 - 17.0 g/dL   HCT 42.5 39.0 - 52.0 %   MCV 89.3 80.0 - 100.0 fL   MCH 29.4 26.0 - 34.0 pg   MCHC 32.9 30.0 - 36.0 g/dL   RDW 11.9 11.5 - 15.5 %   Platelets 285 150 - 400 K/uL   nRBC 0.0 0.0 - 0.2 %   Neutrophils Relative % 61 %   Neutro Abs 4.5 1.7 - 7.7 K/uL   Lymphocytes Relative 31 %   Lymphs Abs 2.3 0.7 - 4.0  K/uL   Monocytes Relative 6 %   Monocytes Absolute 0.4 0.1 - 1.0 K/uL   Eosinophils Relative 1 %   Eosinophils Absolute 0.1 0.0 - 0.5 K/uL   Basophils Relative 1 %   Basophils Absolute 0.0 0.0 - 0.1 K/uL   Immature Granulocytes 0 %   Abs Immature Granulocytes 0.03 0.00 - 0.07 K/uL    Comment: Performed at [facility], [address], [place], [place] [address]  Comprehensive metabolic panel     Status: Abnormal   Collection Time: 07/29/20  5:15 PM  Result Value Ref Range   Sodium 138 135 - 145 mmol/L   Potassium 4.3 3.5 - 5.1 mmol/L   Chloride 100 98 - 111 mmol/L   CO2 27 22 - 32 mmol/L   Glucose, Bld 115 (H) 70 - 99 mg/dL    Comment: Glucose reference range applies only to samples taken after fasting for at least 8 hours.   BUN 12 6 - 20 mg/dL   Creatinine, Ser 0.83 0.61 - 1.24 mg/dL   Calcium 9.4 8.9 - 10.3 mg/dL   Total Protein 6.7 6.5 - 8.1 g/dL   Albumin 4.1 3.5 - 5.0 g/dL   AST 19 15 - 41 U/L   ALT 16 0 - 44 U/L   Alkaline Phosphatase 34 (L) 38 - 126 U/L   Total Bilirubin 0.3 0.3 - 1.2 mg/dL   GFR, Estimated >60 >60 mL/min    Comment: (NOTE) Calculated using the CKD-EPI Creatinine Equation (2021)    Anion gap 11 5 - 15    Comment: Performed at [facility] [address], [place], [place] [address]  Hemoglobin A1c     Status: None   Collection Time: 07/29/20  5:15 PM  Result Value Ref Range   Hgb A1c MFr Bld 4.9 4.8 - 5.6 %    Comment: (NOTE)         Prediabetes: 5.7 - 6.4         Diabetes: >6.4         Glycemic control for adults with diabetes: <7.0    Mean Plasma Glucose 94 mg/dL    Comment: (NOTE) Performed At: [facility], [place], [place] [id number] Rush Farmer MD [id number]   Magnesium     Status: None   Collection Time: 07/29/20  5:15 PM  Result Value Ref Range   Magnesium 2.1 1.7 - 2.4 mg/dL    Comment: Performed at [facility], [place] [address], [place], [place] [address]  Ethanol     Status: None    Collection  Time: 07/29/20  5:15 PM  Result Value Ref Range   Alcohol, Ethyl (B) <10 <10 mg/dL    Comment: (NOTE) Lowest detectable limit for serum alcohol is 10 mg/dL.  For medical purposes only. Performed at [facility], [place] [address], [place], [place] [address]   Lipid panel     Status: None   Collection Time: 07/29/20  5:15 PM  Result Value Ref Range   Cholesterol 150 0 - 200 mg/dL   Triglycerides 74 <150 mg/dL   HDL 54 >40 mg/dL   Total CHOL/HDL Ratio 2.8 RATIO   VLDL 15 0 - 40 mg/dL   LDL Cholesterol 81 0 - 99 mg/dL    Comment:        Total Cholesterol/HDL:CHD Risk Coronary Heart Disease Risk Table                     Men   Women  1/2 Average Risk   3.4   3.3  Average Risk       5.0   4.4  2 X Average Risk   9.6   7.1  3 X Average Risk  23.4   11.0        Use the calculated Patient Ratio above and the CHD Risk Table to determine the patient's CHD Risk.        ATP III CLASSIFICATION (LDL):  <100     mg/dL   Optimal  100-129  mg/dL   Near or Above                    Optimal  130-159  mg/dL   Borderline  160-189  mg/dL   High  >190     mg/dL   Very High Performed at [facility] [address] [place], [place] [address]   TSH     Status: None   Collection Time: 07/29/20  5:15 PM  Result Value Ref Range   TSH 0.847 0.350 - 4.500 uIU/mL    Comment: Performed by a 3rd Generation assay with a functional sensitivity of <=0.01 uIU/mL. Performed at [facility], [place] [address] [place], [place] [address]   POCT Urine Drug Screen - (ICup)     Status: Abnormal   Collection Time: 07/29/20  5:25 PM  Result Value Ref Range   POC Amphetamine UR None Detected NONE DETECTED (Cut Off Level 1000 ng/mL)   POC Secobarbital (BAR) None Detected NONE DETECTED (Cut Off Level 300 ng/mL)   POC Buprenorphine (BUP) None Detected NONE DETECTED (Cut Off Level 10 ng/mL)   POC Oxazepam (BZO) None Detected NONE DETECTED (Cut Off Level 300 ng/mL)   POC Cocaine UR None  Detected NONE DETECTED (Cut Off Level 300 ng/mL)   POC Methamphetamine UR None Detected NONE DETECTED (Cut Off Level 1000 ng/mL)   POC Morphine None Detected NONE DETECTED (Cut Off Level 300 ng/mL)   POC Oxycodone UR None Detected NONE DETECTED (Cut Off Level 100 ng/mL)   POC Methadone UR None Detected NONE DETECTED (Cut Off Level 300 ng/mL)   POC Marijuana UR Positive (A) NONE DETECTED (Cut Off Level 50 ng/mL)  POC SARS Coronavirus 2 Ag     Status: None   Collection Time: 07/29/20  5:30 PM  Result Value Ref Range   SARS Coronavirus 2 Ag NEGATIVE NEGATIVE    Comment: (NOTE) SARS-CoV-2 antigen NOT DETECTED.   Negative results are presumptive.  Negative results do not preclude SARS-CoV-2 infection and  should not be used as the sole basis for treatment or other patient management decisions, including infection  control decisions, particularly in the presence of clinical signs and  symptoms consistent with COVID-19, or in those who have been in contact with the virus.  Negative results must be combined with clinical observations, patient history, and epidemiological information. The expected result is Negative.  Fact Sheet for Patients: HandmadeRecipes.com.cy  Fact Sheet for Healthcare Providers: FuneralLife.at  This test is not yet approved or cleared by the [place] FDA and  has been authorized for detection and/or diagnosis of SARS-CoV-2 by FDA under an Emergency Use Authorization (EUA).  This EUA will remain in effect (meaning this test can be used) for the duration of  the COV ID-19 declaration under Section 564(b)(1) of the Act, 21 U.S.C. section 360bbb-3(b)(1), unless the authorization is terminated or revoked sooner.    Urinalysis, Routine w reflex microscopic Urine, Clean Catch     Status: None   Collection Time: 07/29/20  7:25 PM  Result Value Ref Range   Color, Urine YELLOW YELLOW   APPearance CLEAR CLEAR   Specific  Gravity, Urine 1.025 1.005 - 1.030   pH 5.0 5.0 - 8.0   Glucose, UA NEGATIVE NEGATIVE mg/dL   Hgb urine dipstick NEGATIVE NEGATIVE   Bilirubin Urine NEGATIVE NEGATIVE   Ketones, ur NEGATIVE NEGATIVE mg/dL   Protein, ur NEGATIVE NEGATIVE mg/dL   Nitrite NEGATIVE NEGATIVE   Leukocytes,Ua NEGATIVE NEGATIVE    Comment: Performed at [facility] [address], [place], [place] [address]    Blood Alcohol level:  Lab Results  Component Value Date   ETH <10 07/29/2020   ETH <10 35/46/5681    Metabolic Disorder Labs:  Lab Results  Component Value Date   HGBA1C 4.9 07/29/2020   MPG 94 07/29/2020   MPG 91.06 08/14/2019   No results found for: PROLACTIN Lab Results  Component Value Date   CHOL 150 07/29/2020   TRIG 74 07/29/2020   HDL 54 07/29/2020   CHOLHDL 2.8 07/29/2020   VLDL 15 07/29/2020   LDLCALC 81 07/29/2020   LDLCALC 61 08/14/2019    Current Medications: Current Facility-Administered Medications  Medication Dose Route Frequency Provider Last Rate Last Admin  . acetaminophen (TYLENOL) tablet 650 mg  650 mg Oral Q6H PRN Money, Lowry Ram, FNP      . alum & mag hydroxide-simeth (MAALOX/MYLANTA) 200-200-20 MG/5ML suspension 30 mL  30 mL Oral Q4H PRN Money, Lowry Ram, FNP      . [START ON 08/01/2020] FLUoxetine (PROZAC) capsule 40 mg  40 mg Oral Daily Ethelene Hal, NP      . lurasidone (LATUDA) tablet 40 mg  40 mg Oral Q breakfast Money, Lowry Ram, FNP   40 mg at 07/31/20 0920  . magnesium hydroxide (MILK OF MAGNESIA) suspension 30 mL  30 mL Oral Daily PRN Money, Lowry Ram, FNP      . neomycin-bacitracin-polymyxin (NEOSPORIN) ointment   Topical PRN Cristofano, Dorene Ar, MD   Given at 07/31/20 1110  . spironolactone (ALDACTONE) tablet 50 mg  50 mg Oral BID Money, Lowry Ram, FNP   50 mg at 07/31/20 0920  . tetanus & diphtheria toxoids (adult) (TENIVAC) injection 0.5 mL  0.5 mL Intramuscular Once Ethelene Hal, NP      . traZODone (DESYREL) tablet 50 mg  50 mg  Oral QHS PRN Money, Lowry Ram, FNP   50 mg at 07/30/20 2102   PTA Medications: Medications Prior  to Admission  Medication Sig Dispense Refill Last Dose  . estradiol (VIVELLE-DOT) 0.05 MG/24HR patch Place 1 patch onto the skin 2 (two) times a week. Uses alongside the 0.42m patch for a total dose of 0.117m Uses on Sundays and Wednesdays.     . Marland Kitchenstradiol (VIVELLE-DOT) 0.1 MG/24HR patch Place 1 patch onto the skin 2 (two) times a week. Uses alongside the 0.0512match for a total dose of 0.31m47mses on Sundays and Wednesdays.     . FLMarland Kitchenoxetine (PROZAC) 20 MG capsule Take 1 capsule (20 mg total) by mouth daily. 90 capsule 1   . hydrOXYzine (ATARAX/VISTARIL) 50 MG tablet Take 1 tablet (50 mg total) by mouth daily as needed for anxiety. 30 tablet    . lurasidone (LATUDA) 40 MG TABS tablet Take 1 tablet (40 mg total) by mouth daily. 30 tablet    . spironolactone (ALDACTONE) 50 MG tablet Take 1 tablet (50 mg total) by mouth 2 (two) times daily.     . traZODone (DESYREL) 50 MG tablet Take 1 tablet (50 mg total) by mouth at bedtime as needed for sleep.       Musculoskeletal: Strength & Muscle Tone: within normal limits Gait & Station: normal Patient leans: N/A  Psychiatric Specialty Exam: Physical Exam Constitutional:      Appearance: Normal appearance.  HENT:     Head: Normocephalic and atraumatic.  Musculoskeletal:        General: Normal range of motion.     Cervical back: Normal range of motion.  Neurological:     General: No focal deficit present.     Mental Status: She is alert and oriented to person, place, and time.  Psychiatric:        Attention and Perception: Attention and perception normal.        Speech: Speech normal.        Behavior: Behavior normal. Behavior is cooperative.        Thought Content: Thought content normal.        Cognition and Memory: Cognition normal.     Review of Systems  Constitutional: Negative for activity change and appetite change.  Respiratory:  Negative for chest tightness and shortness of breath.   Cardiovascular: Negative for chest pain.  Gastrointestinal: Negative for abdominal pain.  Neurological: Negative for facial asymmetry and headaches.    Blood pressure 110/72, pulse 93, temperature 98.1 F (36.7 C), temperature source Oral, resp. rate 16, height _0  (1.803 m), weight 93 kg, SpO2 95 %.Body mass index is 28.59 kg/m.  General Appearance: Casual and Fairly Groomed, dressed in street clothes, adequate hygiene  Eye Contact:  Good  Speech:  Clear and Coherent and Normal Rate  Volume:  Normal  Mood:  Depressed  Affect:  Congruent and Depressed  Thought Process:  Coherent and Descriptions of Associations: Intact  Orientation:  Full (Time, Place, and Person)  Thought Content:  Logical and Hallucinations: None  Suicidal Thoughts:  No  Homicidal Thoughts:  No  Memory:  Immediate;   Good Recent;   Good Remote;   Good  Judgement:  Fair  Insight:  Fair  Psychomotor Activity:  Normal  Concentration:  Concentration: Good  Recall:  Good  Fund of Knowledge:  Good  Language:  Good  Akathisia:  No  Handed:  Right  AIMS (if indicated):     Assets:  Communication Skills Desire for Improvement Financial Resources/Insurance Housing Physical Health Resilience Social Support Transportation Vocational/Educational  ADL's:  Intact  Cognition:  WNL  Sleep:       Treatment Plan Summary: Daily contact with patient to assess and evaluate symptoms and progress in treatment and Medication management  Observation Level/Precautions:  15 minute checks  Laboratory: Reviewed labs:  CBC with Alk PHos 34, Glucose 115, otherwise normal, Lipid profile normal, CBC w/Diff no abnormalities, HgbA1c 4.9, TSH 0.847 , UA negative, UDS positive THC, Alcohol negative, EKG NSR with QTc 446. COVID/Flu A&B negative  Psychotherapy:  Group therapy, Therapeutic Milieu  Medications:  See MAR  Consultations:  TBD  Discharge Concerns:  Safety,  suicidality  Estimated LOS: 2-5 days  Other:     Treatment Plan:   In brief: Hancel Ion is a 20 year old transgender male to male who presented to [facility] as a walk-in on 2/15 for evaluation of depression and  suicidal ideation with a plan. She was seen  and admitted to [facility] for overnight observation. Upon reassessment 2/16 she was unable to contract for safety and was admitted to [facility] for acute stabilization and medication management. Her last [facility] admission was March 2021. 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 She sees Dr Tracie Harrier psychiatrist at [facility], and sees her therapist at [facility] twice weekly. Her home  medications have been restarted.   Depression:  - Restart Prozac 40 mg daily - Restart Latuda 40 mg daily   Anxiety:  - Prozac 40 mg daily - Start Vistaril 25 mg three times daily as needed  Insomnia:  -Start Trazodone 50 mg at bedtime as needed, may repeat x 1  Hormone Therapy:  -Restart Spironolactone 50 mg twice daily - Patient uses Estradiol patches, weekly, next patch due Saturday, will order if patient is still hospitalized.   NSSIBB - Wound Care:    - Tenivac 0.36m IM, patient has multiple cuts on left thigh, unsure of last tetanus shot - Neosporin ointment to left thigh superficial wounds  Routine PRN's:  - Tylenol 650 mg every 6 hrs as needed for pain - Maalox/Mylanta 200-200-5ML suspension give 30 ML every 4 hrs as needed for indigestion  Influenza vaccine administered 07/31/2020 (patient request) Pneumonia vaccine administered  07/31/2020 (patient request)   Physician Treatment Plan for Primary Diagnosis: Bipolar disorder, unspecified ([place] Long Term Goal(s): Improvement in symptoms so as ready for discharge  Short Term Goals: Ability to identify changes in lifestyle to reduce recurrence of condition will improve, Ability to verbalize feelings will improve, Ability to disclose and discuss suicidal ideas, Ability to identify and develop  effective coping behaviors will improve, Compliance with prescribed medications will improve and Ability to identify triggers associated with substance abuse/mental health issues will improve  Physician Treatment Plan for Secondary Diagnosis: Principal Problem:   Bipolar disorder, unspecified ([place] Active Problems:   Marijuana abuse   Inhalant abuse ([place]  Long Term Goal(s): Improvement in symptoms so as ready for discharge  Short Term Goals: Ability to identify changes in lifestyle to reduce recurrence of condition will improve, Ability to disclose and discuss suicidal ideas, Ability to demonstrate self-control will improve, Ability to identify and develop effective coping behaviors will improve and Ability to identify triggers associated with substance abuse/mental health issues will improve  I certify that inpatient services furnished can reasonably be expected to improve the patient's condition.    LEthelene Hal NP 2/17/202212:19 PM

## 2020-07-31 NOTE — Progress Notes (Signed)
Cooperative with treatment, patient denies SI, HI &AVH. Patient affect is flat bur she remains sad, she is medication compliant No behavioral issues to report through out the night.

## 2020-07-31 NOTE — Progress Notes (Signed)
Pt denies SI/HI/AVH.  Pt presents with brighter affect and mood today.  Pt believes he has identified some coping skills and ways to get out his anger and frustration and sadness before it escalates too high.   Pt reported that she has a telehealth appointment to see her outpatient  Psychiatrist tomorrow via telehealth visit at 11:00am.  Pt hopes to be discharged by 10 am tomorrow.  Pt is safe on the unit with q 15 min checks in place.

## 2020-07-31 NOTE — BHH Suicide Risk Assessment (Addendum)
[facility] Admission Suicide Risk Assessment   Nursing information obtained from:  Patient Demographic factors:  Transgender male to male; living alone, caucasian Current Mental Status:  Suicidal ideation indicated by patient,Suicide plan,Self-harm thoughts,Self-harm behaviors Loss Factors:  NA Historical Factors: Impulsivity, previous psychiatric diagnoses, substance abuse prior to admission, previous suicide attempts Risk Reduction Factors:  Sense of responsibility to family, full time student and employed, pet in the home  Total Time Spent in Direct Patient Care:  I personally spent 45 minutes on the unit in direct patient care. The direct patient care time included face-to-face time with the patient, reviewing the patient's chart, communicating with other professionals, and coordinating care. Greater than 50% of this time was spent in counseling or coordinating care with the patient regarding goals of hospitalization, psycho-education, and discharge planning needs.  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 She states that prior to admission she was having some stress with school and work and started cutting on her thigh for "an emotional release" in the days prior to admission. She states that about 2-3 weeks ago she had "a manic episode" where for several days in a row she was not needing to sleep, had racing thoughts, was talkative, was committing "small crimes," and was using drugs. She clarifies that during this episode she was stealing shopping carts, egging places, and shoplifting impulsively in the context  of abusing THC and "poppers." She states that after this episode she "crashed" in terms of her mood. She is vague as to how often her manic/hypomanic episodes have been occurring. She denies h/o AVH, paranoia, or delusions. She has been compliant with Prozac 36m and Latuda 423mdaily. She reports daily THC use and recent use of "poppers" which are inhaled muscle relaxers but denies recent use of other illicit drugs or alcohol. She reports a more remote h/o mushroom, acid, and whippet abuse in the past. Currently she states she is doing well in school and feels supported by her family. She lives alone. Today the patient states that her suicidal thoughts have resolved and she is feeling mood stable. She is not interested in making medication changes and requests to discharge with outpatient followup. See H&P for additional admission details.   Continued Clinical Symptoms:  Alcohol Use Disorder Identification Test Final Score (AUDIT): 0 The "Alcohol Use Disorders Identification Test", Guidelines for Use in Primary Care, Second Edition.  [facility] Score between 0-7:  no or low risk or alcohol related problems. Score between 8-15:  moderate risk of alcohol related problems. Score between 16-19:  high risk of alcohol related problems. Score 20 or above:  warrants further diagnostic evaluation for alcohol dependence and treatment.  CLINICAL FACTORS:   More than one psychiatric diagnosis Previous Psychiatric Diagnoses and Treatments Substance abuse prior to admission Bipolar depression diagnosis  Musculoskeletal: Strength & Muscle Tone: within normal limits Gait & Station: normal Patient leans: N/A  Psychiatric Specialty Exam: Physical Exam Vitals reviewed.  HENT:     Head: Normocephalic.  Pulmonary:     Effort: Pulmonary effort is normal.  Skin:    Comments: Superficial, multiple, linear curs on upper left thigh  Neurological:     Mental Status: She is alert.      Review of Systems  Constitutional: Negative for fatigue.  Respiratory: Negative for shortness of breath.   Cardiovascular: Negative for chest pain.  Gastrointestinal: Negative for abdominal pain.  Skin: Negative for rash.  Neurological: Negative for headaches.     Blood pressure 110/72, pulse 93, temperature 98.1 F (36.7 C), temperature source Oral, resp. rate 16, height '5\' 11"'  (1.803 m), weight 93 kg, SpO2 95 %.Body mass index is 28.59 kg/m.  General Appearance: Fair hygiene, appears stated age, well engaged with examiner  Eye Contact:  Good  Speech:  Clear and Coherent and Normal Rate  Volume:  Normal  Mood:  Described as stable and improved - appears calm  Affect:  mildly constricted  Thought Process:  Goal Directed and Linear  Orientation:  Full (Time, Place, and Person)  Thought Content:  Denies AVH, paranoia or delusions; no obsessions/compulsions or active psychosis noted on exam  Suicidal Thoughts:  Denied  Homicidal Thoughts:  Denied  Memory:  Recent;   Good  Judgement:  Fair  Insight:  Fair  Psychomotor Activity:  Normal  Concentration:  Concentration: Good and Attention Span: Good  Recall:  Good  Fund of Knowledge:  Good  Language:  Good  Akathisia:  Negative  Assets:  Communication Skills Desire for Improvement Housing Social Support Vocational/Educational  ADL's:  Intact  Cognition:  WNL   COGNITIVE FEATURES THAT CONTRIBUTE TO RISK:  None    SUICIDE RISK:   Mild:  Suicidal ideation of limited frequency, intensity, duration, and specificity.  There are no identifiable plans, no associated intent, mild dysphoria and related symptoms, good self-control (both objective and subjective assessment), few other risk factors, and identifiable protective factors, including available and accessible social support.  PLAN OF CARE: Time was spent discussing various treatment options with the patient including potential medication changes or dose increases in his current  medications. He is not interested in this and wishes to remain on his home medication regimen. He states he would instead like to discuss potential medication changes with his outpatient psychiatrist. He was counseled extensively on the need to abstain from alcohol and illicit substances after discharge. He was educated on the negative impact that these substances have on his neurochemistry and on the effectiveness of his psychotropic medications. Social work will assist in [place] and discharge planning and patient will be observed for 24 hours for mood stability and safety prior to potential discharge. Admission labs were reviewed: CMP unremarkable other than glucose 115 and alk phos 34. Cholesterol 150, HDL 54, LDL 81, triglycerides 74, WBC 7.4, H/H 14/42.5, platelets 285, HbgA1c 4.9, TSH 0.847, respiratory panel negative, UA unremarkable, alcohol <10, UDS positive for THC, EKG NSR QTc 446. She will be given Td booster and topical neosporin to wounds.   I certify that inpatient services furnished can reasonably be expected to improve the patient's condition.   Harlow Asa, MD, FAPA 07/31/2020, 12:16 PM

## 2020-07-31 NOTE — BHH Suicide Risk Assessment (Signed)
[facility] Discharge Suicide Risk Assessment   Principal Problem: Bipolar disorder, unspecified (HCC) Discharge Diagnoses: Principal Problem:   Bipolar disorder, unspecified (H[facility]) Active Problems:   Marijuana abuse   Inhalant abuse ([facility]) cluster B traits  Total Time Spent in Direct Patient Care:  I personally spent 30 minutes on the unit in direct patient care. The direct patient care time included face-to-face time with the patient, reviewing the patient's chart, communicating with other professionals, and coordinating care. Greater than 50% of this time was spent in counseling or coordinating care with the patient regarding goals of hospitalization, psycho-education, and discharge planning needs.  Subjective:  Edwin Burton is a 20 year old transgender male to male patient who presented to [facility] as a walk-in on 07/29/20  for evaluation of depression and suicidal ideation with a plan to die by carbon monoxide poisoning. She has a psychiatric history of MDD and GAD has had previous suicide attempts. She was seen and admitted to [facility] for overnight observation. Upon reassessment 07/30/20 she was unable to contract for safety and was admitted to [facility] for acute stabilization and medication management.   On assessment today, the patient reports that she slept well and has had a good appetite. She denies SI, intent or plan or urges for self-harm. She has reflected on our conversation yesterday and has decided to work on self-drawing or punch tattooing rather than cutting when she gets out if she has urges for self-harm. She plans to put up the knives and sharp objects in her home in a secured place to avoid temptation for cutting after discharge. She denies HI, AVH, or paranoia. She denies medication side-effects and agrees to discuss potential medication changes with her psychiatrist at her scheduled follow up appointment today at 11am. She does not want to stay in the hospital to make medication changes at this time.  She denies manic or hypomanic symptoms at this time and reports a stable mood. She has plans to have lunch with her father today and then has a scheduled therapy appointment tonight at 7pm. She can discuss her safety plan and appears forward thinking on assessment. She was again advised to abstain from illicit substances and alcohol after discharge.   Musculoskeletal: Strength & Muscle Tone: within normal limits Gait & Station: normal Patient leans: N/A  Psychiatric Specialty Exam: General Appearance: Fair hygiene, appears stated age, well engaged with examiner  Eye Contact:  Good  Speech:  Clear and Coherent and Normal Rate  Volume:  Normal  Mood:  Described as stable and improved - appears calm and more euthymic  Affect:  moderate, stable, full  Thought Process:  Goal Directed and Linear  Orientation:  Full (Time, Place, and Person)  Thought Content:  Denies AVH, paranoia or delusions; no obsessions/compulsions or active psychosis noted on exam  Suicidal Thoughts:  Denied  Homicidal Thoughts:  Denied  Memory:  Recent;   Good  Judgement:  Fair  Insight:  Fair  Psychomotor Activity:  Normal  Concentration:  Concentration: Good and Attention Span: Good  Recall:  Good  Fund of Knowledge:  Good  Language:  Good  Akathisia:  Negative  Assets:  Communication Skills Desire for Improvement Housing Social Support Vocational/Educational  ADL's:  Intact  Cognition:  WNL   Mental Status Per Nursing Assessment::   On Admission:  Suicidal ideation indicated by patient,Suicide plan,Self-harm thoughts,Self-harm behaviors  Demographic Factors:  Adolescent or young adult and Caucasian, Transgender male to male, living alone  Loss Factors: NA  Historical  Factors: Impulsivity and substance abuse prior to admission; previous psychiatric diagnoses; previous suicide attempts  Risk Reduction Factors:   Sense of responsibility to family, Employed and Full time student; pet in  home  Continued Clinical Symptoms:  Previous Psychiatric Diagnoses and Treatments; previous suicide attempts Unspecified bipolar diagnosis Substance use prior to admission  Cognitive Features That Contribute To Risk:  None    Suicide Risk:  Mild:  Suicidal ideation of limited frequency, intensity, duration, and specificity.  There are no identifiable plans, no associated intent, mild dysphoria and related symptoms, good self-control (both objective and subjective assessment), few other risk factors, and identifiable protective factors, including available and accessible social support.   Follow-up Information    [facility] Follow up on 08/08/2020.   Why: You have an appointment for therapy services on  08/08/20 at 7:00 pm.  This will be a  Virtual appointment.  Contact information: [address] [place] [address] [phone]        [facility] Follow up.   Why: sees for medication management services Contact information: [address] [place], [place] [address]  Phone: [phone]             Plan Of Care/Follow-up recommendations:  Activity:  as tolerated Diet:  heart healthy Other:  Patient advised to keep scheduled mental health outpatient appointments and to comply with medications. Patient was advised to abstain from alcohol and illicit drugs. Patient will need ongoing metabolic labs, weight monitroing, and EKG monitoring while on an atypical antipsychotic.  Comer Locket, MD, FAPA 07/31/2020, 3:25 PM

## 2020-08-01 DIAGNOSIS — F3130 Bipolar disorder, current episode depressed, mild or moderate severity, unspecified: Secondary | ICD-10-CM

## 2020-08-01 MED ORDER — FLUOXETINE HCL 40 MG PO CAPS: 40.0000 mg | ORAL_CAPSULE | Freq: Every day | ORAL | 0 refills | Status: AC

## 2020-08-01 MED ORDER — LURASIDONE HCL 40 MG PO TABS: 40.0000 mg | ORAL_TABLET | Freq: Every day | ORAL | 0 refills | Status: AC

## 2020-08-01 MED ORDER — TRAZODONE HCL 50 MG PO TABS
50.0000 mg | ORAL_TABLET | Freq: Every evening | ORAL | 0 refills | Status: AC | PRN
Start: 2020-08-01 — End: ?

## 2020-08-01 NOTE — Tx Team (Signed)
Interdisciplinary Treatment and Diagnostic Plan Update  08/01/2020 Time of Session: 9:20am  Julia Kulzer MRN: [id number]  Principal Diagnosis: Bipolar I disorder, most recent episode depressed (HCC)  Secondary Diagnoses: Principal Problem:   Bipolar I disorder, most recent episode depressed (HCC) Active Problems:   Bipolar disorder, unspecified (HCC)   Marijuana abuse   Inhalant abuse (HCC)   Current Medications:  Current Facility-Administered Medications  Medication Dose Route Frequency Provider Last Rate Last Admin  . acetaminophen (TYLENOL) tablet 650 mg  650 mg Oral Q6H PRN Money, Gerlene Burdock, FNP      . alum & mag hydroxide-simeth (MAALOX/MYLANTA) 200-200-20 MG/5ML suspension 30 mL  30 mL Oral Q4H PRN Money, Gerlene Burdock, FNP      . FLUoxetine (PROZAC) capsule 40 mg  40 mg Oral Daily Laveda Abbe, NP   40 mg at 08/01/20 3329  . lurasidone (LATUDA) tablet 40 mg  40 mg Oral Q breakfast Money, Gerlene Burdock, FNP   40 mg at 08/01/20 0837  . magnesium hydroxide (MILK OF MAGNESIA) suspension 30 mL  30 mL Oral Daily PRN Money, Gerlene Burdock, FNP      . neomycin-bacitracin-polymyxin (NEOSPORIN) ointment   Topical PRN Cristofano, Worthy Rancher, MD   Given at 07/31/20 1110  . spironolactone (ALDACTONE) tablet 50 mg  50 mg Oral BID Money, Gerlene Burdock, FNP   50 mg at 08/01/20 0837  . traZODone (DESYREL) tablet 50 mg  50 mg Oral QHS,MR X 1 Laveda Abbe, NP   50 mg at 07/31/20 2131   Current Outpatient Medications  Medication Sig Dispense Refill  . estradiol (VIVELLE-DOT) 0.1 MG/24HR patch Place 1 patch onto the skin 2 (two) times a week. Uses alongside the 0.05mg  patch for a total dose of 0.15mg . Uses on Sundays and Wednesdays.    Melene Muller ON 08/02/2020] FLUoxetine (PROZAC) 40 MG capsule Take 1 capsule (40 mg total) by mouth daily. For depression 30 capsule 0  . [START ON 08/02/2020] lurasidone (LATUDA) 40 MG TABS tablet Take 1 tablet (40 mg total) by mouth daily with breakfast. For mood control 30  tablet 0  . spironolactone (ALDACTONE) 50 MG tablet Take 1 tablet (50 mg total) by mouth 2 (two) times daily.    . traZODone (DESYREL) 50 MG tablet Take 1 tablet (50 mg total) by mouth at bedtime as needed for sleep. 30 tablet 0   PTA Medications: No medications prior to admission.    Patient Stressors:    Patient Strengths:    Treatment Modalities: Medication Management, Group therapy, Case management,  1 to 1 session with clinician, Psychoeducation, Recreational therapy.   Physician Treatment Plan for Primary Diagnosis: Bipolar I disorder, most recent episode depressed (HCC) Long Term Goal(s): Improvement in symptoms so as ready for discharge Improvement in symptoms so as ready for discharge   Short Term Goals: Ability to identify changes in lifestyle to reduce recurrence of condition will improve Ability to verbalize feelings will improve Ability to disclose and discuss suicidal ideas Ability to identify and develop effective coping behaviors will improve Compliance with prescribed medications will improve Ability to identify triggers associated with substance abuse/mental health issues will improve Ability to identify changes in lifestyle to reduce recurrence of condition will improve Ability to disclose and discuss suicidal ideas Ability to demonstrate self-control will improve Ability to identify and develop effective coping behaviors will improve Ability to identify triggers associated with substance abuse/mental health issues will improve  Medication Management: Evaluate patient's response, side effects, and tolerance of  medication regimen.  Therapeutic Interventions: 1 to 1 sessions, Unit Group sessions and Medication administration.  Evaluation of Outcomes: Adequate for Discharge  Physician Treatment Plan for Secondary Diagnosis: Principal Problem:   Bipolar I disorder, most recent episode depressed (HCC) Active Problems:   Bipolar disorder, unspecified (HCC)    Marijuana abuse   Inhalant abuse (HCC)  Long Term Goal(s): Improvement in symptoms so as ready for discharge Improvement in symptoms so as ready for discharge   Short Term Goals: Ability to identify changes in lifestyle to reduce recurrence of condition will improve Ability to verbalize feelings will improve Ability to disclose and discuss suicidal ideas Ability to identify and develop effective coping behaviors will improve Compliance with prescribed medications will improve Ability to identify triggers associated with substance abuse/mental health issues will improve Ability to identify changes in lifestyle to reduce recurrence of condition will improve Ability to disclose and discuss suicidal ideas Ability to demonstrate self-control will improve Ability to identify and develop effective coping behaviors will improve Ability to identify triggers associated with substance abuse/mental health issues will improve     Medication Management: Evaluate patient's response, side effects, and tolerance of medication regimen.  Therapeutic Interventions: 1 to 1 sessions, Unit Group sessions and Medication administration.  Evaluation of Outcomes: Adequate for Discharge   RN Treatment Plan for Primary Diagnosis: Bipolar I disorder, most recent episode depressed (HCC) Long Term Goal(s): Knowledge of disease and therapeutic regimen to maintain health will improve  Short Term Goals: Ability to remain free from injury will improve, Ability to participate in decision making will improve, Ability to verbalize feelings will improve, Ability to disclose and discuss suicidal ideas and Ability to identify and develop effective coping behaviors will improve  Medication Management: RN will administer medications as ordered by provider, will assess and evaluate patient's response and provide education to patient for prescribed medication. RN will report any adverse and/or side effects to prescribing  provider.  Therapeutic Interventions: 1 on 1 counseling sessions, Psychoeducation, Medication administration, Evaluate responses to treatment, Monitor vital signs and CBGs as ordered, Perform/monitor CIWA, COWS, AIMS and Fall Risk screenings as ordered, Perform wound care treatments as ordered.  Evaluation of Outcomes: Adequate for Discharge   LCSW Treatment Plan for Primary Diagnosis: Bipolar I disorder, most recent episode depressed (HCC) Long Term Goal(s): Safe transition to appropriate next level of care at discharge, Engage patient in therapeutic group addressing interpersonal concerns.  Short Term Goals: Engage patient in aftercare planning with referrals and resources, Increase social support, Increase emotional regulation, Facilitate acceptance of mental health diagnosis and concerns, Identify triggers associated with mental health/substance abuse issues and Increase skills for wellness and recovery  Therapeutic Interventions: Assess for all discharge needs, 1 to 1 time with Social worker, Explore available resources and support systems, Assess for adequacy in community support network, Educate family and significant other(s) on suicide prevention, Complete Psychosocial Assessment, Interpersonal group therapy.  Evaluation of Outcomes: Adequate for Discharge   Progress in Treatment: Attending groups: Yes. Participating in groups: Yes. Taking medication as prescribed: Yes. Toleration medication: Yes. Family/Significant other contact made: No, will contact:  Declined Consents  Patient understands diagnosis: Yes. Discussing patient identified problems/goals with staff: Yes. Medical problems stabilized or resolved: Yes. Denies suicidal/homicidal ideation: Yes. Issues/concerns per patient self-inventory: No.   New problem(s) identified: No, Describe:  None   New Short Term/Long Term Goal(s): medication stabilization, elimination of SI thoughts, development of comprehensive mental  wellness plan.   Patient Goals:  "To be patient  and nicer to myself"   Discharge Plan or Barriers: Patient will return home and will follow up with [facility]    Reason for Continuation of Hospitalization: Medication stabilization  Estimated Length of Stay: Adequate for discharge   Attendees: Patient: Donaldson Richter  08/01/2020   Physician: Pricilla Larsson, MD  08/01/2020   Nursing:  08/01/2020   RN Care Manager: 08/01/2020   Social Worker: Melba Coon, LCSWA  08/01/2020   Recreational Therapist:  08/01/2020   Other:  08/01/2020   Other:  08/01/2020   Other: 08/01/2020     Scribe for Treatment Team: Aram Beecham, LCSWA 08/01/2020 11:09 AM

## 2020-08-01 NOTE — Progress Notes (Signed)
Pt discharged to lobby. Pt was stable and appreciative at that time. All papers and prescriptions were given and valuables returned. Verbal understanding expressed. Denies SI/HI and A/VH. Pt given opportunity to express concerns and ask questions.  

## 2020-08-01 NOTE — Progress Notes (Signed)
   08/01/20 0304  Psych Admission Type (Psych Patients Only)  Admission Status Involuntary  Psychosocial Assessment  Patient Complaints Depression  Eye Contact Brief  Facial Expression Anxious  Affect Anxious  Speech Logical/coherent  Interaction Assertive  Motor Activity Other (Comment) (wnl)  Appearance/Hygiene Unremarkable  Behavior Characteristics Cooperative;Appropriate to situation  Mood Anxious;Depressed  Thought Process  Coherency WDL  Content WDL  Delusions None reported or observed  Perception WDL  Hallucination None reported or observed  Judgment WDL  Confusion None  Danger to Self  Current suicidal ideation? Denies  Danger to Others  Danger to Others None reported or observed   Pt denies SI, HI, AVH and pain. Rates depression 4/10. Looking forward to discharge and spending time with her father.

## 2020-08-01 NOTE — Discharge Summary (Signed)
Physician Discharge Summary Note  Patient:  Edwin Burton is an 20 y.o., adult MRN:  [id number] DOB:  05/16/2001 Patient phone:  [phone] (home)  Patient address:   [address] [place] [id number],  Total Time spent with patient: Greater than 30 minutes  Date of Admission:  07/30/2020  Date of Discharge: 08-01-20  Reason for Admission:    Principal Problem: Bipolar disorder, unspecified [facility](HCC)  Discharge Diagnoses: Principal Problem:   Bipolar disorder, unspecified (HCC) Active Problems:   Marijuana abuse   Inhalant abuse (HCC)  Past Psychiatric History: Bipolar disorder  Past Medical History: History reviewed. No pertinent past medical history. History reviewed. No pertinent surgical history.  Family History: History reviewed. No pertinent family history.  Family Psychiatric  History: See H&P  Social History:  Social History   Substance and Sexual Activity  Alcohol Use None     Social History   Substance and Sexual Activity  Drug Use Not on file    Social History   Socioeconomic History  . Marital status: Single    Spouse name: Not on file  . Number of children: Not on file  . Years of education: Not on file  . Highest education level: Not on file  Occupational History  . Not on file  Tobacco Use  . Smoking status: Never Smoker  . Smokeless tobacco: Never Used  Substance and Sexual Activity  . Alcohol use: Not on file  . Drug use: Not on file  . Sexual activity: Not on file  Other Topics Concern  . Not on file  Social History Narrative  . Not on file   Social Determinants of Health   Financial Resource Strain: Not on file  Food Insecurity: Not on file  Transportation Needs: Not on file  Physical Activity: Not on file  Stress: Not on file  Social Connections: Not on file   Hospital Course: (Per Md's admission SRA notes): Edwin Burton is a 20 year old transgender male to male patient who [facility] 07/29/20  for  evaluation of depression and suicidal ideation with a plan to die by carbon monoxide poisoning. She has a psychiatric history of MDD and GAD has had previous suicide attempts. [facility] and [facility]. Upon reassessment 07/30/20 she was unable to contract for safety and [facility] acute stabilization and medication management.  On assessment today, the patient reports that she slept well and has had a good appetite. She denies SI, intent or plan or urges for self-harm. She has reflected on our conversation yesterday and has decided to work on self-drawing or punch tattooing rather than cutting when she gets out if she has urges for self-harm. She plans to put up the knives and sharp objects in her home in a secured place to avoid temptation for cutting after discharge. She denies HI, AVH, or paranoia. She denies medication side-effects and agrees to discuss potential medication changes with her psychiatrist at her scheduled follow up appointment today at 11am. She does not want to stay in the hospital to make medication changes at this time. She denies manic or hypomanic symptoms at this time and reports a stable mood. She has plans to have lunch with her father today and then has a scheduled therapy appointment tonight at 7pm. She can discuss her safety plan and appears forward thinking on assessment. She was again advised to abstain from illicit substances and alcohol after discharge.  This istheseconddischarge summary from this [facility] for this49year old transgender male. She was a patient in this [facility] in March 2nd of 2021 thru March 3rd of 2021 with similar presentation. At the time, her hospitalization was very brief. She does haveprevious hx of mental illness &suicidal ideations.She reported onthis presentadmission that she was having worsening symptoms of depression with plan to kill herself by carbon monoxide.  She was brought to the hospital  for evaluation & treatment.  After evaluation of herpresenting symptoms, it was jointly agreed by the treatment team torecommend Alexfor mood stabilization treatments. The medication regimentargeting those presenting symptomswere discussed &initiatedwith herconsent. Edwin Burton received, stabilized & discharged on themedicationsas listed below on her discharge medication lists. She was also enrolled & participated in the group counseling sessions being offered & held on this unit.She learned coping skills.She presented other significant medical issues that required treatment  & or monitoring.She was resumed & discharged on all herpertinent home medications for those health issues. She tolerated hertreatment regimen without any adverse effects or reactions reported.   Edwin Burton's symptoms responded well to hertreatment regimen. This is evidenced by her reports of improved mood, resolution of symptoms &presentation of good affect/eye contact.Sheiscurrently mentally & medically stable for dischargeto continue mental health careas recommended below.   Today upon discharge evaluationwith the attending psychiatrist today,[name], "I'm doing good. I feel much better".She denies any specific concerns.She is sleeping well. Herappetite is good. She denies other physical complaints.She denies SI/HI/AH/VH, delusions or paranoia.She is tolerating hermedications well &in agreement to continue hercurrent regimen as noted on the discharge medication lists below.She follow up for routine psychiatric care, counseling services &medication management as noted below. She is provided with all the necessary information needed to make these appointments without problems. She was able to engage in safety planning including plan to return to [facility] or contact emergency services if she feels unable to maintain herown safety or the safety of others. Pt had no further questions, comments or concerns. She left  [facility] with all personal belongings in no apparent distress.Transportation per herself (patient has her car at the [facility] parking lot).  Physical Findings: AIMS:  , ,  ,  ,    CIWA:    COWS:     Musculoskeletal: Strength & Muscle Tone: within normal limits Gait & Station: normal Patient leans: N/A  Psychiatric Specialty Exam: Physical Exam Vitals and nursing note reviewed.  HENT:     Head: Normocephalic.     Nose: Nose normal.     Mouth/Throat:     Pharynx: Oropharynx is clear.  Eyes:     Pupils: Pupils are equal, round, and reactive to light.  Cardiovascular:     Rate and Rhythm: Normal rate.     Pulses: Normal pulses.  Pulmonary:     Effort: Pulmonary effort is normal.  Genitourinary:    Comments: Deferred Musculoskeletal:        General: Normal range of motion.     Cervical back: Normal range of motion.  Skin:    General: Skin is warm and dry.  Neurological:     General: No focal deficit present.     Mental Status: She is alert and oriented to person, place, and time.     Review of Systems  Constitutional: Negative for chills, diaphoresis and fever.  HENT: Negative for congestion, rhinorrhea, sneezing and sore throat.   Eyes: Negative for discharge.  Respiratory: Negative for cough, shortness of breath and wheezing.   Cardiovascular: Negative for chest pain and palpitations.  Gastrointestinal: Negative for diarrhea, nausea and vomiting.  Endocrine: Negative for cold intolerance.  Genitourinary: Negative for difficulty urinating.  Musculoskeletal: Negative for arthralgias and myalgias.  Skin: Negative.   Allergic/Immunologic:       Allergies: NKDA  Neurological: Negative for dizziness, tremors, seizures, syncope, facial asymmetry, speech difficulty, weakness, light-headedness, numbness and headaches.  Psychiatric/Behavioral: Positive for dysphoric mood (Stabilized with medication prior to discharge) and sleep disturbance (Stabilized with medication prior to  discharge). Negative for agitation, behavioral problems, confusion, decreased concentration, hallucinations, self-injury and suicidal ideas. The patient is not nervous/anxious and is not hyperactive.     Blood pressure 122/64, pulse (!) 101, temperature 97.6 F (36.4 C), temperature source Oral, resp. rate 16, height 5\' 11"  (1.803 m), weight 93 kg, SpO2 95 %.Body mass index is 28.59 kg/m.  General Appearance: See Md's discharge SRA  Sleep:  Number of Hours: 5.5   Have you used any form of tobacco in the last 30 days? (Cigarettes, Smokeless Tobacco, Cigars, and/or Pipes): Yes  Has this patient used any form of tobacco in the last 30 days? (Cigarettes, Smokeless Tobacco, Cigars, and/or Pipes): Yes, an FDA-approved tobacco cessation medication was recommended at discharge.  Blood Alcohol level:  Lab Results  Component Value Date   ETH <10 07/29/2020   ETH <10 08/14/2019   Metabolic Disorder Labs:  Lab Results  Component Value Date   HGBA1C 4.9 07/29/2020   MPG 94 07/29/2020   MPG 91.06 08/14/2019   No results found for: PROLACTIN Lab Results  Component Value Date   CHOL 150 07/29/2020   TRIG 74 07/29/2020   HDL 54 07/29/2020   CHOLHDL 2.8 07/29/2020   VLDL 15 07/29/2020   LDLCALC 81 07/29/2020   LDLCALC 61 08/14/2019   See Psychiatric Specialty Exam and Suicide Risk Assessment completed by Attending Physician prior to discharge.  Discharge destination:  Home  Is patient on multiple antipsychotic therapies at discharge:  No   Has Patient had three or more failed trials of antipsychotic monotherapy by history:  No  Recommended Plan for Multiple Antipsychotic Therapies: NA  Allergies as of 08/01/2020   No Known Allergies     Medication List    STOP taking these medications   hydrOXYzine 50 MG tablet Commonly known as: ATARAX/VISTARIL     TAKE these medications     Indication  estradiol 0.1 MG/24HR patch Commonly known as: VIVELLE-DOT Place 1 patch onto the skin 2  (two) times a week. Uses alongside the 0.05mg  patch for a total dose of 0.15mg . Uses on Sundays and Wednesdays. What changed: Another medication with the same name was removed. Continue taking this medication, and follow the directions you see here.  Indication: Transgender male   FLUoxetine 40 MG capsule Commonly known as: PROZAC Take 1 capsule (40 mg total) by mouth daily. For depression Start taking on: August 02, 2020 What changed:   medication strength  how much to take  additional instructions  Indication: Generalized Anxiety Disorder, Major Depressive Disorder   lurasidone 40 MG Tabs tablet Commonly known as: LATUDA Take 1 tablet (40 mg total) by mouth daily with breakfast. For mood control Start taking on: August 02, 2020 What changed:   when to take this  additional instructions  Indication: Mood control   spironolactone 50 MG tablet Commonly known as: ALDACTONE Take 1 tablet (50 mg total) by mouth 2 (two) times daily.  Indication: Person Born Male, Identifies as Male   traZODone 50 MG tablet Commonly known as: DESYREL Take  1 tablet (50 mg total) by mouth at bedtime as needed for sleep.  Indication: Trouble Sleeping       Follow-up Information    [facility] Follow up on 08/08/2020.   Why: You have an appointment for therapy services on  08/08/20 at 7:00 pm.  This will be a  Virtual appointment.  Contact information: [address] [place] [address] [phone]        [facility]. Go on 08/11/2020.   Why: You have a hospital follow up appointment for medication management services on 08/11/20 at 3;00 pm. This appointment will be held in person.   Contact information: [address] [place], [place] [address]  Phone: [phone]             Signed: Armandina Stammer, NP, PMHNP, FNP-BC 08/01/2020, 9:20 AM

## 2020-08-01 NOTE — Progress Notes (Signed)
  [facility] Adult Case Management Discharge Plan :  Will you be returning to the same living situation after discharge:  Yes,  apartment At discharge, do you have transportation home?: Yes,  personal car Do you have the ability to pay for your medications: Yes,  has insurance  Release of information consent forms completed and in the chart;  Patient's signature needed at discharge.  Patient to Follow up at:  Follow-up Information    [facility] Follow up on 08/08/2020.   Why: You have an appointment for therapy services on  08/08/20 at 7:00 pm.  This will be a  Virtual appointment.  Contact information: [address] [place] [address] [phone]        [facility]. Go on 08/11/2020.   Why: You have a hospital follow up appointment for medication management services on 08/11/20 at 3;00 pm. This appointment will be held in person.   Contact information: [address] [place], [place] [address]  Phone: [phone]              Next level of care provider has access to [facility] Link:no  Safety Planning and Suicide Prevention discussed: Yes,  w/ pt  Have you used any form of tobacco in the last 30 days? (Cigarettes, Smokeless Tobacco, Cigars, and/or Pipes): Yes  Has patient been referred to the Quitline?: Patient refused referral  Patient has been referred for addiction treatment: N/A  Felizardo Hoffmann, LCSWA 08/01/2020, 9:18 AM

## 2020-08-01 NOTE — Progress Notes (Signed)
Recreation Therapy Notes  Date:  2.18.22 Time: 0930 Location: [address] Group Room  Group Topic: Stress Management  Goal Area(s) Addresses:  Patient will identify positive stress management techniques. Patient will identify benefits of using stress management post d/c.  Behavioral Response: Engaged  Intervention: Stress Management  Activity: Meditation.  LRT played a meditation on mindful walking. Patients were to listen and envision themselves walking along the beach enjoying sights, sounds and the feeling of what it would be like to feel the sun and water on their skin.  Patients were to listen and follow along as meditation played to engage in activity.    Education:  Stress Management, Discharge Planning.   Education Outcome: Acknowledges Education  Clinical Observations/Feedback: Pt attended and participated in group.    Caroll Rancher, LRT/CTRS         Caroll Rancher A 08/01/2020 11:26 AM

## 2020-09-27 ENCOUNTER — Emergency Department (HOSPITAL_COMMUNITY)
Admission: EM | Admit: 2020-09-27 | Discharge: 2020-09-27 | Disposition: A | Discharge status: Home / Self Care | Attending: Emergency Medicine | Admitting: Emergency Medicine

## 2020-09-27 ENCOUNTER — Emergency Department (HOSPITAL_COMMUNITY)

## 2020-09-27 ENCOUNTER — Other Ambulatory Visit: Payer: Self-pay

## 2020-09-27 ENCOUNTER — Encounter (HOSPITAL_COMMUNITY): Payer: Self-pay | Admitting: Emergency Medicine

## 2020-09-27 DIAGNOSIS — S61412A Laceration without foreign body of left hand, initial encounter: Secondary | ICD-10-CM

## 2020-09-27 DIAGNOSIS — S61215A Laceration without foreign body of left ring finger without damage to nail, initial encounter: Secondary | ICD-10-CM | POA: Insufficient documentation

## 2020-09-27 DIAGNOSIS — Y990 Civilian activity done for income or pay: Secondary | ICD-10-CM | POA: Diagnosis not present

## 2020-09-27 DIAGNOSIS — Y9289 Other specified places as the place of occurrence of the external cause: Secondary | ICD-10-CM | POA: Diagnosis not present

## 2020-09-27 HISTORY — DX: Depression, unspecified: F32.A

## 2020-09-27 HISTORY — DX: Unspecified mood (affective) disorder: F39

## 2020-09-27 MED ORDER — BACITRACIN ZINC 500 UNIT/GM EX OINT: TOPICAL_OINTMENT | Freq: Two times a day (BID) | CUTANEOUS | Status: DC

## 2020-09-27 MED ORDER — LIDOCAINE HCL (PF) 1 % IJ SOLN
5.0000 mL | Freq: Once | INTRAMUSCULAR | Status: AC
  Administered 2020-09-27: 5 mL
  Filled 2020-09-27: qty 5

## 2020-09-27 NOTE — Discharge Instructions (Addendum)
Please follow-up with your primary care provider regarding today's ED encounter and for wound check.  You need to have your sutures removed in 7 days.  In the interim, please keep it clean and covered.  I recommend bacitracin or similar over-the-counter antibiotic cream to be applied twice daily.  Please read the attachment on laceration wound care.  I have also provided you with a referral to hand surgery to have, as needed.  However, I feel as though outpatient follow-up with your primary care provider is more reasonable.  Return to the ED or seek immediate medical attention should you experience any new or worsening symptoms.

## 2020-09-27 NOTE — ED Provider Notes (Signed)
Made in error.  Please see other note which is complete.   Lorelee New, PA-C 10/06/20 2002    Tegeler, Canary Brim, MD 10/08/20 [phone]

## 2020-09-27 NOTE — ED Triage Notes (Addendum)
C/o lacerations to L hand from punching a glass fish tank 1 hour ago while at work.  Coban dressing in place.

## 2020-09-27 NOTE — ED Provider Notes (Signed)
[facility] EMERGENCY DEPARTMENT Provider Note   CSN: [id number] Arrival date & time: 09/27/20  1223     History Chief Complaint  Patient presents with  . Extremity Laceration    Edwin Burton is a 20 y.o. adult with past medical history of mood disorder who presents the ED with complaints of laceration to left hand after punching a glass fish tank.  On my examination, patient reports that she often will act impulsively and punched a fish tank and it shattered.  She then proceeded to punch again.  She denies any other injuries, numbness or weakness, or any other symptoms.  Pain and bleeding are both controlled.  She is moving all of her fingers.  She tells me that she just had her tetanus updated last year.  HPI     Past Medical History:  Diagnosis Date  . Depression   . Mood disorder [employer])     Patient Active Problem List   Diagnosis Date Noted  . Bipolar I disorder, most recent episode depressed (HCC) 08/01/2020  . Bipolar disorder, unspecified (HCC) 07/31/2020  . Marijuana abuse 07/31/2020  . Inhalant abuse (HCC) 07/31/2020  . Anxiety state 08/14/2019  . Suicide (HCC) 08/14/2019  . Suicidal ideation 08/14/2019  . MDD (major depressive disorder), severe (HCC) 08/14/2019    Past Surgical History:  Procedure Laterality Date  . APPENDECTOMY         No family history on file.  Social History   Tobacco Use  . Smoking status: Never Smoker  . Smokeless tobacco: Never Used  Substance Use Topics  . Alcohol use: Not Currently  . Drug use: Yes    Types: Marijuana    Home Medications Prior to Admission medications   Medication Sig Start Date End Date Taking? Authorizing Provider  estradiol (VIVELLE-DOT) 0.1 MG/24HR patch Place 1 patch onto the skin 2 (two) times a week. Uses alongside the 0.05mg  patch for a total dose of 0.15mg . Uses on Sundays and Wednesdays.    [provider]  FLUoxetine (PROZAC) 40 MG capsule Take 1 capsule (40  mg total) by mouth daily. For depression 08/02/20   Armandina Stammer I, NP  lurasidone (LATUDA) 40 MG TABS tablet Take 1 tablet (40 mg total) by mouth daily with breakfast. For mood control 08/02/20   Armandina Stammer I, NP  spironolactone (ALDACTONE) 50 MG tablet Take 1 tablet (50 mg total) by mouth 2 (two) times daily. 07/30/20   Money, Gerlene Burdock, FNP  traZODone (DESYREL) 50 MG tablet Take 1 tablet (50 mg total) by mouth at bedtime as needed for sleep. 08/01/20   Sanjuana Kava, NP    Allergies    Patient has no known allergies.  Review of Systems   Review of Systems  All other systems reviewed and are negative.   Physical Exam Updated Vital Signs BP 127/89 (BP Location: Left Arm)   Pulse (!) 107   Temp 98.2 F (36.8 C) (Oral)   Resp 18   SpO2 97%   Physical Exam Vitals and nursing note reviewed. Exam conducted with a chaperone present.  Constitutional:      General: She is not in acute distress.    Appearance: Normal appearance. She is not toxic-appearing.  HENT:     Head: Normocephalic and atraumatic.  Eyes:     General: No scleral icterus.    Conjunctiva/sclera: Conjunctivae normal.  Cardiovascular:     Rate and Rhythm: Normal rate.     Pulses: Normal pulses.  Pulmonary:  Effort: Pulmonary effort is normal. No respiratory distress.  Skin:    General: Skin is dry.     Capillary Refill: Capillary refill takes less than 2 seconds.  Left hand: Very small linear nonbleeding abrasions to ulnar aspect of third finger and fifth finger near MCP joints.  There is also a larger laceration that is slightly curved over fourth MCP joint.  Not very well approximated.  Relatively superficial.  Radial pulse intact.  Capillary refill less than 2 seconds.  All fingers are functionally intact with flexion and extension against resistance.  Neurovascularly intact.  Sensation is symmetric.  No significant swelling.  No obvious foreign body. Neurological:     Mental Status: She is alert and oriented  to person, place, and time.     GCS: GCS eye subscore is 4. GCS verbal subscore is 5. GCS motor subscore is 6.      Psychiatric:        Mood and Affect: Mood normal.        Behavior: Behavior normal.        Thought Content: Thought content normal.     ED Results / Procedures / Treatments   Labs (all labs ordered are listed, but only abnormal results are displayed) Labs Reviewed - No data to display  EKG None  Radiology DG Hand Complete Left  Result Date: 09/27/2020 CLINICAL DATA:  Punched glass.  One wound near the fourth MCP joint. EXAM: LEFT HAND - COMPLETE 3+ VIEW COMPARISON:  None. FINDINGS: There is a bandage along the dorsal aspect of the MCP joints. No evidence for a radiopaque foreign body. Negative for acute fracture or dislocation. Oval-shaped area of sclerosis involving the distal aspect of the middle finger proximal phalanx. This structure measures up to 1.2 cm. IMPRESSION: 1. No acute bone abnormality. 2. Sclerotic structure involving the middle finger proximal phalanx. This is suggestive for a benign etiology such as bone island. Old infection or atypical osteoid osteoma would also be in the differential. This is likely an incidental finding unless the patient is symptomatic in this region. Electronically Signed   By: Richarda Overlie M.D.   On: 09/27/2020 14:41    Procedures Procedures   Medications Ordered in ED Medications  bacitracin ointment (has no administration in time range)  lidocaine (PF) (XYLOCAINE) 1 % injection 5 mL (5 mLs Infiltration Given 09/27/20 1444)    ED Course  I have reviewed the triage vital signs and the nursing notes.  Pertinent labs & imaging results that were available during my care of the patient were reviewed by me and considered in my medical decision making (see chart for details).    MDM Rules/Calculators/A&P                          Edwin Burton was evaluated in Emergency Department on 09/27/2020 for the symptoms described in  the history of present illness. She was evaluated in the context of the global COVID-19 pandemic, which necessitated consideration that the patient might be at risk for infection with the SARS-CoV-2 virus that causes COVID-19. Institutional protocols and algorithms that pertain to the evaluation of patients at risk for COVID-19 are in a state of rapid change based on information released by regulatory bodies including the CDC and federal and state organizations. These policies and algorithms were followed during the patient's care in the ED.  I personally reviewed patient's medical chart and all notes from triage and staff during today's encounter.  I have also ordered and reviewed all labs and imaging that I felt to be medically necessary in the evaluation of this patient's complaints and with consideration of their physical exam. If needed, translation services were available and utilized.   Patient with laceration over fourth MCP joint requiring repair with 3 sutures.  I used Prolene.  They would need to be removed in 7 days.  She is up-to-date on her tetanus immunization.  Plain films of hand were obtained and personally reviewed which demonstrate no obvious foreign body.  I was able to explore the wound which was superficial and did not see any foreign bodies.  It was irrigated copiously at bedside.  Her hand is functionally neurovascularly intact.  The wound was much better approximated with suture repair.  She is very pleased with outcome.   Patient can follow-up with her primary care provider for ongoing evaluation and management.  Recommended wound care check in 3 days.  I will also provide her with referral to hand surgery to have as needed.  ED return precautions discussed.  Wound care instructions provided.  Patient voices understanding and is agreeable to the plan.  Final Clinical Impression(s) / ED Diagnoses Final diagnoses:  Laceration of left hand without foreign body, initial encounter     Rx / DC Orders ED Discharge Orders    None       Lorelee New, PA-C 09/27/20 1545    Tegeler, Canary Brim, MD 09/27/20 [phone]

## 2020-09-27 NOTE — ED Provider Notes (Incomplete)
[facility] EMERGENCY DEPARTMENT Provider Note   CSN: [id number] Arrival date & time: 09/27/20  1223     History Chief Complaint  Patient presents with  . Extremity Laceration    Edwin Burton is a 20 y.o. adult with past medical history of mood disorder who presents the ED with complaints of laceration to left hand after punching a glass fish tank.  On my examination, patient reports that she often will act impulsively and punched a fish tank and it shattered.  She denies any other injuries, numbness or weakness, or any other symptoms.  Pain and bleeding are both controlled.  She tells me that she just had her tetanus updated last year.  HPI     Past Medical History:  Diagnosis Date  . Depression   . Mood disorder [facility])     Patient Active Problem List   Diagnosis Date Noted  . Bipolar I disorder, most recent episode depressed (HCC) 08/01/2020  . Bipolar disorder, unspecified (HCC) 07/31/2020  . Marijuana abuse 07/31/2020  . Inhalant abuse (HCC) 07/31/2020  . Anxiety state 08/14/2019  . Suicide (HCC) 08/14/2019  . Suicidal ideation 08/14/2019  . MDD (major depressive disorder), severe (HCC) 08/14/2019    Past Surgical History:  Procedure Laterality Date  . APPENDECTOMY         No family history on file.  Social History   Tobacco Use  . Smoking status: Never Smoker  . Smokeless tobacco: Never Used  Substance Use Topics  . Alcohol use: Not Currently  . Drug use: Yes    Types: Marijuana    Home Medications Prior to Admission medications   Medication Sig Start Date End Date Taking? Authorizing Provider  estradiol (VIVELLE-DOT) 0.1 MG/24HR patch Place 1 patch onto the skin 2 (two) times a week. Uses alongside the 0.05mg  patch for a total dose of 0.15mg . Uses on Sundays and Wednesdays.    [provider]  FLUoxetine (PROZAC) 40 MG capsule Take 1 capsule (40 mg total) by mouth daily. For depression 08/02/20   Armandina Stammer I, NP   lurasidone (LATUDA) 40 MG TABS tablet Take 1 tablet (40 mg total) by mouth daily with breakfast. For mood control 08/02/20   Armandina Stammer I, NP  spironolactone (ALDACTONE) 50 MG tablet Take 1 tablet (50 mg total) by mouth 2 (two) times daily. 07/30/20   Money, Gerlene Burdock, FNP  traZODone (DESYREL) 50 MG tablet Take 1 tablet (50 mg total) by mouth at bedtime as needed for sleep. 08/01/20   Sanjuana Kava, NP    Allergies    Patient has no known allergies.  Review of Systems   Review of Systems  All other systems reviewed and are negative.   Physical Exam Updated Vital Signs BP 127/89 (BP Location: Left Arm)   Pulse (!) 107   Temp 98.2 F (36.8 C) (Oral)   Resp 18   SpO2 97%   Physical Exam Vitals and nursing note reviewed. Exam conducted with a chaperone present.  Constitutional:      General: She is not in acute distress.    Appearance: Normal appearance. She is not toxic-appearing.  HENT:     Head: Normocephalic and atraumatic.  Eyes:     General: No scleral icterus.    Conjunctiva/sclera: Conjunctivae normal.  Cardiovascular:     Rate and Rhythm: Normal rate.     Pulses: Normal pulses.  Pulmonary:     Effort: Pulmonary effort is normal. No respiratory distress.  Skin:  General: Skin is dry.     Capillary Refill: Capillary refill takes less than 2 seconds.  Neurological:     Mental Status: She is alert and oriented to person, place, and time.     GCS: GCS eye subscore is 4. GCS verbal subscore is 5. GCS motor subscore is 6.  Psychiatric:        Mood and Affect: Mood normal.        Behavior: Behavior normal.        Thought Content: Thought content normal.     ED Results / Procedures / Treatments   Labs (all labs ordered are listed, but only abnormal results are displayed) Labs Reviewed - No data to display  EKG None  Radiology No results found.  Procedures Procedures {Remember to document critical care time when appropriate:1}  Medications Ordered in  ED Medications - No data to display  ED Course  I have reviewed the triage vital signs and the nursing notes.  Pertinent labs & imaging results that were available during my care of the patient were reviewed by me and considered in my medical decision making (see chart for details).    MDM Rules/Calculators/A&P                          Edwin Burton was evaluated in Emergency Department on 09/27/2020 for the symptoms described in the history of present illness. She was evaluated in the context of the global COVID-19 pandemic, which necessitated consideration that the patient might be at risk for infection with the SARS-CoV-2 virus that causes COVID-19. Institutional protocols and algorithms that pertain to the evaluation of patients at risk for COVID-19 are in a state of rapid change based on information released by regulatory bodies including the CDC and federal and state organizations. These policies and algorithms were followed during the patient's care in the ED.  I personally reviewed patient's medical chart and all notes from triage and staff during today's encounter. I have also ordered and reviewed all labs and imaging that I felt to be medically necessary in the evaluation of this patient's complaints and with consideration of their physical exam. If needed, translation services were available and utilized.   ***    Final Clinical Impression(s) / ED Diagnoses Final diagnoses:  None    Rx / DC Orders ED Discharge Orders    None

## 2022-09-06 IMAGING — CR DG HAND COMPLETE 3+V*L*
3 series · 3 of 3 positions shown · non-contrast
Comparison: None.

CLINICAL DATA: Punched glass.  One wound near the fourth MCP joint.

EXAM:
LEFT HAND - COMPLETE 3+ VIEW

[hand pa]
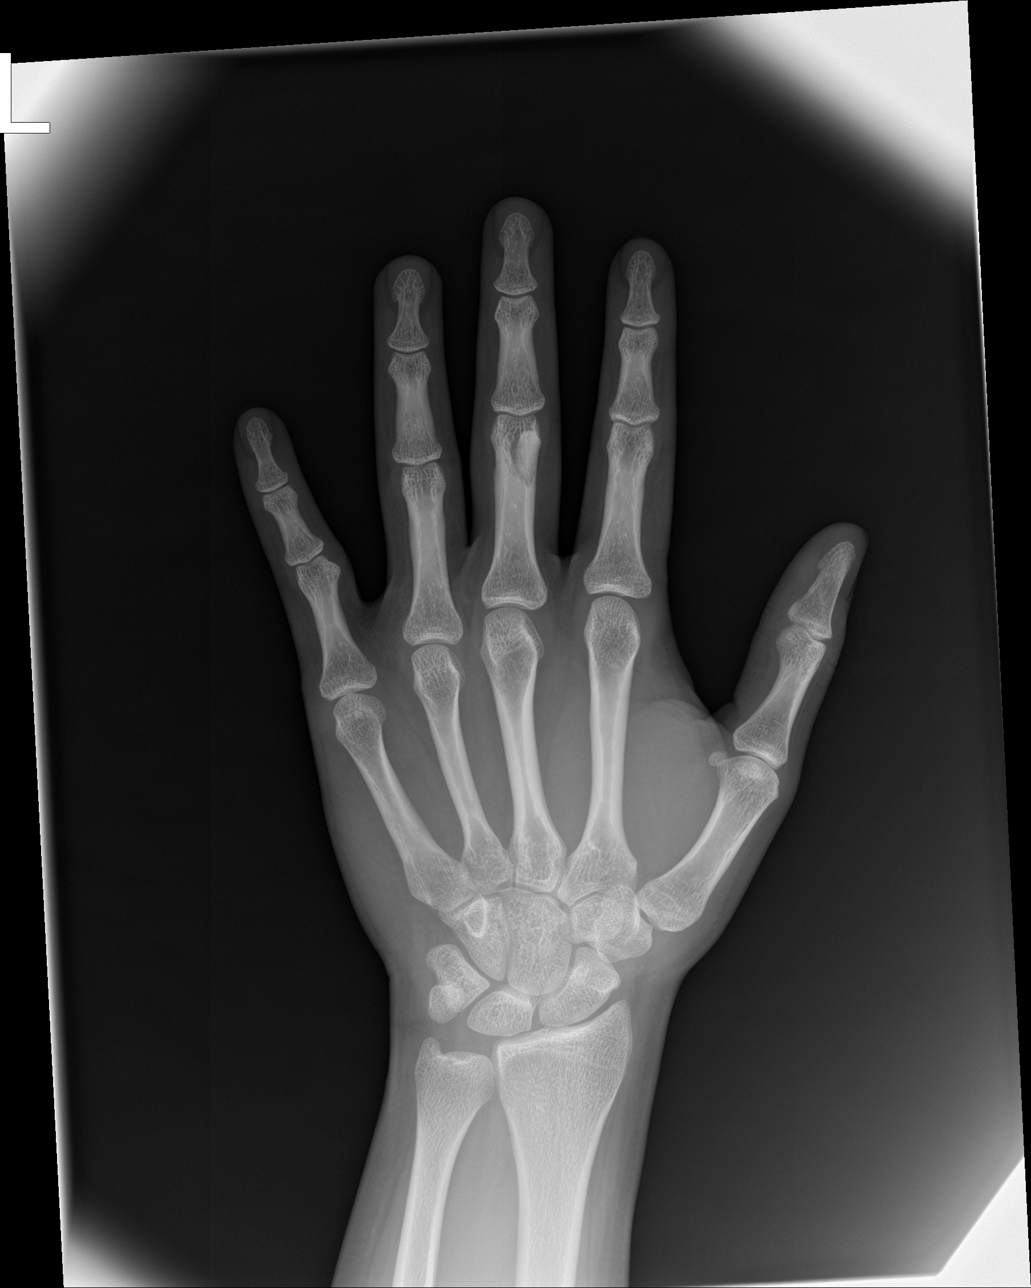

[hand obl]
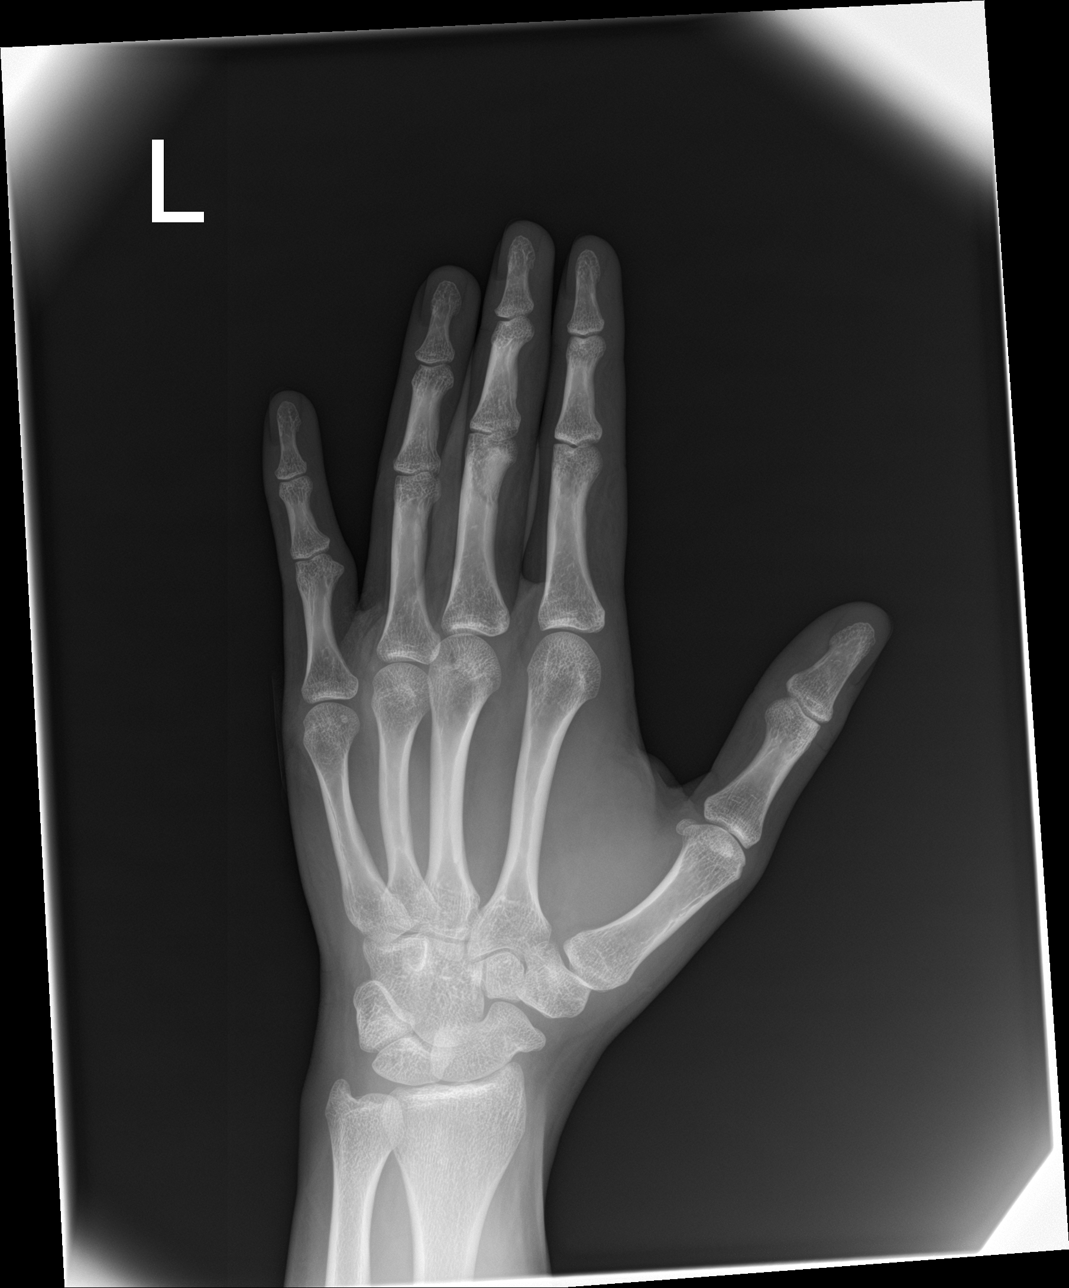

[hand lat]
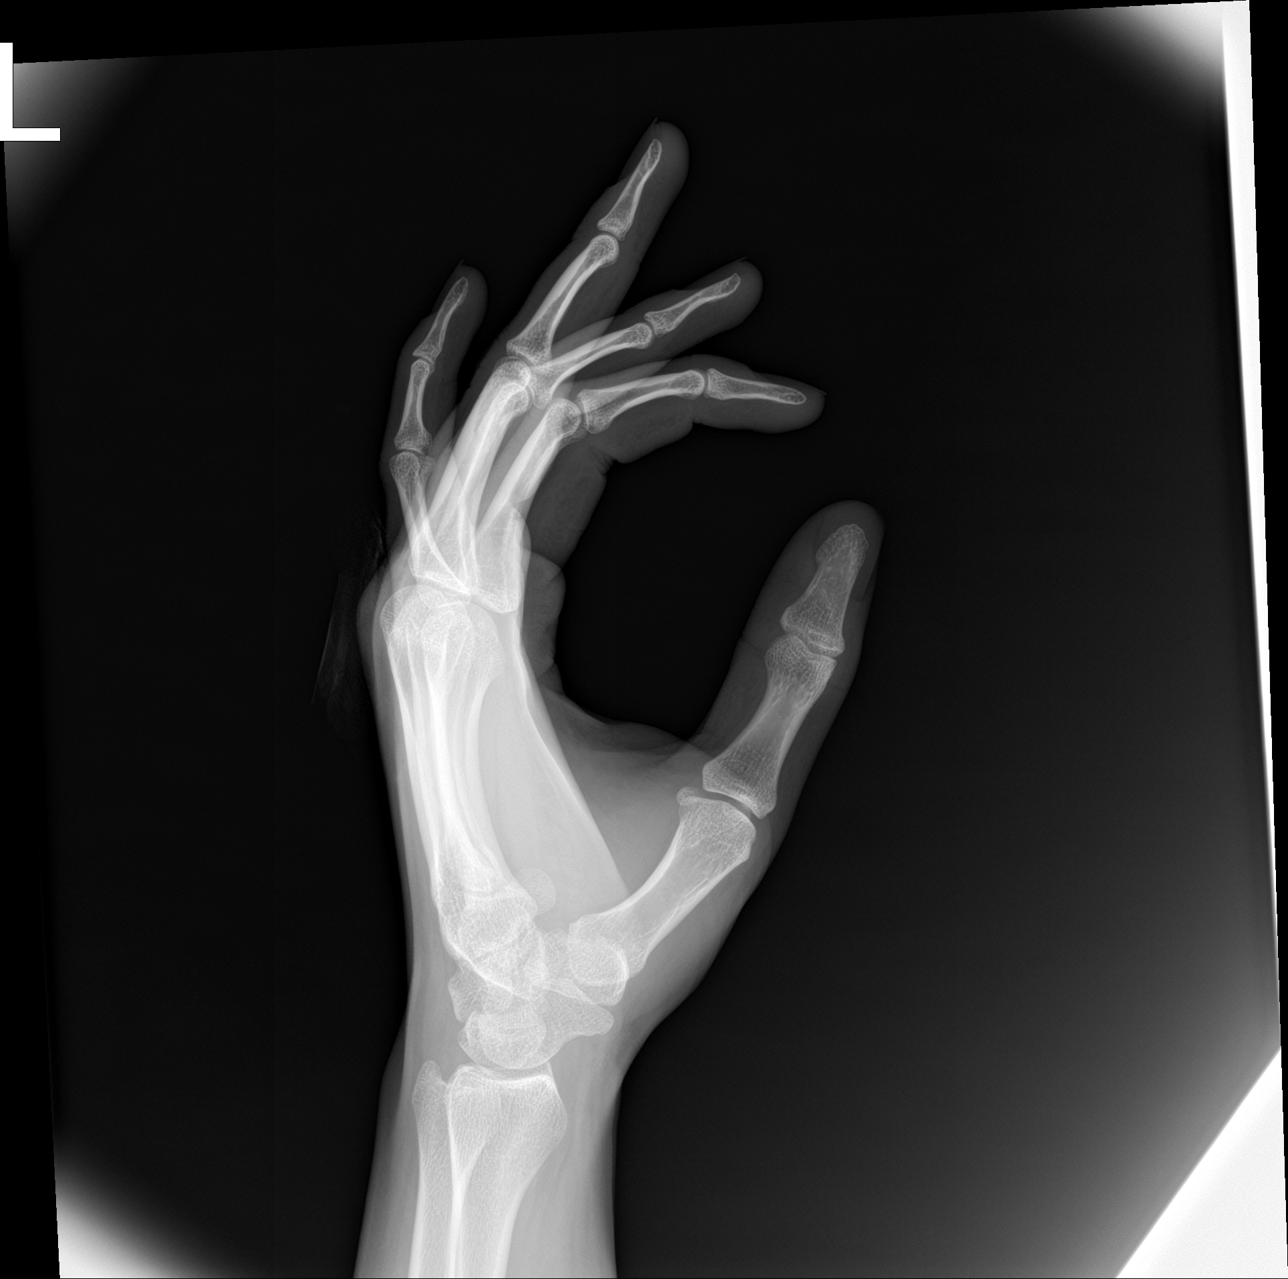

[3 of 3 positions shown; findings below may reference images not displayed]

FINDINGS: There is a bandage along the dorsal aspect of the MCP joints. No
evidence for a radiopaque foreign body. Negative for acute fracture
or dislocation. Oval-shaped area of sclerosis involving the distal
aspect of the middle finger proximal phalanx. This structure
measures up to 1.2 cm.
IMPRESSION: 1. No acute bone abnormality.
2. Sclerotic structure involving the middle finger proximal phalanx.
This is suggestive for a benign etiology such as bone island. Old
infection or atypical osteoid osteoma would also be in the
differential. This is likely an incidental finding unless the
patient is symptomatic in this region.
